# Patient Record
Sex: Female | Born: 1965 | Race: Black or African American | Hispanic: No | Marital: Single | Smoking: Never smoker
Health system: Southern US, Community
[De-identification: ages and names within clinical notes are randomized; demographics above are authoritative.]

## PROBLEM LIST (undated history)

## (undated) DIAGNOSIS — E119 Type 2 diabetes mellitus without complications: Secondary | ICD-10-CM

## (undated) DIAGNOSIS — I10 Essential (primary) hypertension: Secondary | ICD-10-CM

---

## 2007-08-13 ENCOUNTER — Emergency Department (HOSPITAL_COMMUNITY): Admission: EM | Admit: 2007-08-13 | Discharge: 2007-08-14 | Payer: Self-pay | Admitting: Emergency Medicine

## 2008-04-30 ENCOUNTER — Ambulatory Visit: Payer: Self-pay | Admitting: Internal Medicine

## 2008-05-05 ENCOUNTER — Ambulatory Visit: Payer: Self-pay | Admitting: *Deleted

## 2008-05-07 ENCOUNTER — Ambulatory Visit (HOSPITAL_COMMUNITY): Admission: RE | Admit: 2008-05-07 | Discharge: 2008-05-07 | Payer: Self-pay | Admitting: Family Medicine

## 2008-06-07 ENCOUNTER — Ambulatory Visit: Payer: Self-pay | Admitting: Internal Medicine

## 2008-06-07 LAB — CONVERTED CEMR LAB
AST: 14 units/L (ref 0–37)
Albumin: 4.2 g/dL (ref 3.5–5.2)
Alkaline Phosphatase: 56 units/L (ref 39–117)
BUN: 15 mg/dL (ref 6–23)
Basophils Relative: 0 % (ref 0–1)
Calcium: 9.6 mg/dL (ref 8.4–10.5)
Chloride: 106 meq/L (ref 96–112)
Glucose, Bld: 98 mg/dL (ref 70–99)
HDL: 49 mg/dL (ref >39)
LDL Cholesterol: 135 mg/dL — ABNORMAL HIGH (ref 0–99)
Lymphs Abs: 2.9 10*3/uL (ref 0.7–4.0)
Monocytes Relative: 6 % (ref 3–12)
Neutro Abs: 2 10*3/uL (ref 1.7–7.7)
Neutrophils Relative %: 36 % — ABNORMAL LOW (ref 43–77)
Platelets: 343 10*3/uL (ref 150–400)
Potassium: 4 meq/L (ref 3.5–5.3)
RBC: 4.64 M/uL (ref 3.87–5.11)
Sodium: 141 meq/L (ref 135–145)
TSH: 1.344 microintl units/mL (ref 0.350–4.50)
Total Protein: 7.4 g/dL (ref 6.0–8.3)
WBC: 5.5 10*3/uL (ref 4.0–10.5)

## 2008-06-25 ENCOUNTER — Ambulatory Visit: Payer: Self-pay | Admitting: Internal Medicine

## 2008-06-25 ENCOUNTER — Encounter: Payer: Self-pay | Admitting: Family Medicine

## 2008-06-26 ENCOUNTER — Encounter: Payer: Self-pay | Admitting: Family Medicine

## 2011-06-11 LAB — URINALYSIS, ROUTINE W REFLEX MICROSCOPIC
Bilirubin Urine: NEGATIVE
Glucose, UA: >1000 — AB
Ketones, ur: >80 — AB
Leukocytes, UA: NEGATIVE
Protein, ur: NEGATIVE
pH: 5

## 2011-06-11 LAB — DIFFERENTIAL
Eosinophils Absolute: 0.1 — ABNORMAL LOW
Eosinophils Relative: 1
Lymphocytes Relative: 25
Lymphs Abs: 2.1
Monocytes Absolute: 0.3
Monocytes Relative: 4

## 2011-06-11 LAB — CBC
HCT: 41.4
Hemoglobin: 13.8
MCV: 86.7
RBC: 4.77
WBC: 8.2

## 2011-06-11 LAB — BASIC METABOLIC PANEL
BUN: 15
Chloride: 98
GFR calc non Af Amer: 50 — ABNORMAL LOW
Potassium: 4.1
Sodium: 132 — ABNORMAL LOW

## 2011-06-11 LAB — URINE MICROSCOPIC-ADD ON

## 2012-12-05 ENCOUNTER — Emergency Department (HOSPITAL_COMMUNITY)
Admission: EM | Admit: 2012-12-05 | Discharge: 2012-12-05 | Disposition: A | Discharge status: Home / Self Care | Payer: Self-pay | Attending: Emergency Medicine | Admitting: Emergency Medicine

## 2012-12-05 ENCOUNTER — Encounter (HOSPITAL_COMMUNITY): Payer: Self-pay | Admitting: Emergency Medicine

## 2012-12-05 DIAGNOSIS — K089 Disorder of teeth and supporting structures, unspecified: Secondary | ICD-10-CM | POA: Insufficient documentation

## 2012-12-05 DIAGNOSIS — K029 Dental caries, unspecified: Secondary | ICD-10-CM

## 2012-12-05 DIAGNOSIS — R221 Localized swelling, mass and lump, neck: Secondary | ICD-10-CM | POA: Insufficient documentation

## 2012-12-05 DIAGNOSIS — R220 Localized swelling, mass and lump, head: Secondary | ICD-10-CM | POA: Insufficient documentation

## 2012-12-05 MED ORDER — PENICILLIN V POTASSIUM 500 MG PO TABS: 500.0000 mg | ORAL_TABLET | Freq: Three times a day (TID) | ORAL | Status: DC

## 2012-12-05 MED ORDER — HYDROCODONE-ACETAMINOPHEN 5-325 MG PO TABS: 1.0000 | ORAL_TABLET | Freq: Four times a day (QID) | ORAL | Status: DC | PRN

## 2012-12-05 NOTE — ED Notes (Signed)
Pt c/o dental abscess to right posterior portion of mouth. Pt states it is painful to chew on that side but denies difficulty swallowing.

## 2012-12-05 NOTE — ED Provider Notes (Signed)
History    This chart was scribed for non-physician practitioner working with Olivia Mackie, MD by Frederik Pear, ED Scribe. This patient was seen in room TR04C/TR04C and the patient's care was started at 2312.   CSN: [id number]  Arrival date & time 12/05/12  2132   First MD Initiated Contact with Patient 12/05/12 2312      Chief Complaint  Patient presents with  . Dental Pain    (Consider location/radiation/quality/duration/timing/severity/associated sxs/prior treatment) Patient is a 47 y.o. female presenting with tooth pain. The history is provided by the patient and medical records. No language interpreter was used.  Dental PainThe primary symptoms include mouth pain and dental injury. The symptoms began yesterday. The symptoms are worsening. The symptoms are new. The symptoms occur constantly.  Additional symptoms include: facial swelling.    Tracie Ellis is a 47 y.o. female who presents to the Emergency Department complaining of sudden onset, constant, gradually worsening dental pain to the right upper posterior mouth with associated facial swelling to the right jaw that is aggravated by chewing on the right side that began yesterday. She reports that she has been treating the pain with ibuprofen, which has provided relief. She denies having a dentist.   History reviewed. No pertinent past medical history.  History reviewed. No pertinent past surgical history.  No family history on file.  History  Substance Use Topics  . Smoking status: Never Smoker   . Smokeless tobacco: Not on file  . Alcohol Use: No    OB History   Grav Para Term Preterm Abortions TAB SAB Ect Mult Living                  Review of Systems  HENT: Positive for facial swelling and dental problem.   All other systems reviewed and are negative.    Allergies  Review of patient's allergies indicates no known allergies.  Home Medications   Current Outpatient Rx  Name  Route  Sig  Dispense  Refill   . ibuprofen (ADVIL,MOTRIN) 200 MG tablet   Oral   Take 1,400 mg by mouth daily as needed for pain.           BP 156/101  Pulse 77  Temp(Src) 98.1 F (36.7 C) (Oral)  Resp 20  SpO2 95%  Physical Exam  Nursing note and vitals reviewed. Constitutional: She is oriented to person, place, and time. She appears well-developed and well-nourished. No distress.  HENT:  Head: Normocephalic and atraumatic. No trismus in the jaw.  Mouth/Throat: Uvula is midline and oropharynx is clear and moist. No oral lesions. Abnormal dentition. Dental caries present. No dental abscesses.    Tooth #1 is broken with swelling appreciated to the right upper jaw.  Eyes: EOM are normal. Pupils are equal, round, and reactive to light.  Neck: Normal range of motion. Neck supple. No tracheal deviation present.  Cardiovascular: Normal rate.   Pulmonary/Chest: Effort normal. No respiratory distress.  Abdominal: Soft. She exhibits no distension.  Musculoskeletal: Normal range of motion. She exhibits no edema.  Neurological: She is alert and oriented to person, place, and time.  Skin: Skin is warm and dry.  Psychiatric: She has a normal mood and affect. Her behavior is normal.    ED Course  Procedures (including critical care time)  DIAGNOSTIC STUDIES: Oxygen Saturation is 95% on room air, adequate by my interpretation.    COORDINATION OF CARE:  23:13- Discussed planned course of treatment with the patient, including a course of  antibiotics and following up with a dentist, who is agreeable at this time.   Labs Reviewed - No data to display No results found.   No diagnosis found.  Dental pain.  Facial swellling.  Antibiotic, analgesic, dental referral.  MDM   I personally performed the services described in this documentation, which was scribed in my presence. The recorded information has been reviewed and is accurate.         Jimmye Norman, NP 12/05/12 [phone]

## 2012-12-05 NOTE — ED Notes (Addendum)
C/o R upper dental pain since yesterday.  Pain relieved with ibuprofen.  Denies pain at present.

## 2012-12-06 NOTE — ED Provider Notes (Signed)
Medical screening examination/treatment/procedure(s) were performed by non-physician practitioner and as supervising physician I was immediately available for consultation/collaboration.  Olivia Mackie, MD 12/06/12 [phone]

## 2013-03-19 ENCOUNTER — Emergency Department (INDEPENDENT_AMBULATORY_CARE_PROVIDER_SITE_OTHER)
Admission: EM | Admit: 2013-03-19 | Discharge: 2013-03-19 | Disposition: A | Discharge status: Home / Self Care | Payer: PRIVATE HEALTH INSURANCE | Source: Home / Self Care | Attending: Family Medicine | Admitting: Family Medicine

## 2013-03-19 ENCOUNTER — Encounter (HOSPITAL_COMMUNITY): Payer: Self-pay | Admitting: Emergency Medicine

## 2013-03-19 DIAGNOSIS — J020 Streptococcal pharyngitis: Secondary | ICD-10-CM

## 2013-03-19 LAB — POCT RAPID STREP A: Streptococcus, Group A Screen (Direct): NEGATIVE

## 2013-03-19 MED ORDER — AMOXICILLIN 500 MG PO CAPS: 500.0000 mg | ORAL_CAPSULE | Freq: Three times a day (TID) | ORAL | Status: DC

## 2013-03-19 NOTE — ED Provider Notes (Signed)
   History    CSN: [id number] Arrival date & time 03/19/13  1221  First MD Initiated Contact with Patient 03/19/13 1408     No chief complaint on file.  (Consider location/radiation/quality/duration/timing/severity/associated sxs/prior Treatment) Patient is a 47 y.o. female presenting with pharyngitis. The history is provided by the patient.  Sore Throat This is a new problem. The current episode started more than 2 days ago. The problem has not changed since onset.The symptoms are aggravated by swallowing.   History reviewed. No pertinent past medical history. History reviewed. No pertinent past surgical history. No family history on file. History  Substance Use Topics  . Smoking status: Never Smoker   . Smokeless tobacco: Not on file  . Alcohol Use: No   OB History   Grav Para Term Preterm Abortions TAB SAB Ect Mult Living                 Review of Systems  Constitutional: Negative.   HENT: Positive for sore throat.   Hematological: Positive for adenopathy.    Allergies  Review of patient's allergies indicates no known allergies.  Home Medications   Current Outpatient Rx  Name  Route  Sig  Dispense  Refill  . amoxicillin (AMOXIL) 500 MG capsule   Oral   Take 1 capsule (500 mg total) by mouth 3 (three) times daily.   30 capsule   0   . HYDROcodone-acetaminophen (NORCO/VICODIN) 5-325 MG per tablet   Oral   Take 1 tablet by mouth every 6 (six) hours as needed for pain.   12 tablet   0   . ibuprofen (ADVIL,MOTRIN) 200 MG tablet   Oral   Take 1,400 mg by mouth daily as needed for pain.         Marland Kitchen penicillin v potassium (VEETID) 500 MG tablet   Oral   Take 1 tablet (500 mg total) by mouth 3 (three) times daily.   30 tablet   0    BP 151/84  Pulse 80  Temp(Src) 98.1 F (36.7 C) (Oral)  Resp 16  SpO2 100% Physical Exam  Nursing note and vitals reviewed. Constitutional: She is oriented to person, place, and time. She appears well-developed and  well-nourished.  HENT:  Head: Normocephalic.  Right Ear: External ear normal.  Left Ear: External ear normal.  Mouth/Throat: Uvula is midline and mucous membranes are normal. Oropharyngeal exudate and posterior oropharyngeal erythema present.  Neck: Normal range of motion. Neck supple.  Pulmonary/Chest: Breath sounds normal.  Lymphadenopathy:    She has cervical adenopathy.  Neurological: She is alert and oriented to person, place, and time.  Skin: Skin is warm and dry.    ED Course  Procedures (including critical care time) Labs Reviewed - No data to display No results found. 1. Strep sore throat     MDM    Linna Hoff, MD 03/19/13 1419

## 2013-03-19 NOTE — ED Notes (Signed)
Pt c/o ST onset Sunday that's gradually getting worse... sxs include odynophagia, chills... Tracie Ellis: f/v/n/d... She is alert w/no signs of acute distress.

## 2013-03-22 LAB — CULTURE, GROUP A STREP

## 2013-03-22 NOTE — ED Notes (Addendum)
Throat culture: Strep beta hemolytic not group A.  Pt. adequately treated with Amoxicillin. Vassie Moselle 03/22/2013

## 2013-09-17 ENCOUNTER — Emergency Department (HOSPITAL_COMMUNITY)
Admission: EM | Admit: 2013-09-17 | Discharge: 2013-09-17 | Disposition: A | Discharge status: Home / Self Care | Payer: PRIVATE HEALTH INSURANCE | Attending: Emergency Medicine | Admitting: Emergency Medicine

## 2013-09-17 ENCOUNTER — Encounter (HOSPITAL_COMMUNITY): Payer: Self-pay | Admitting: Emergency Medicine

## 2013-09-17 DIAGNOSIS — K029 Dental caries, unspecified: Secondary | ICD-10-CM

## 2013-09-17 MED ORDER — TRAMADOL HCL 50 MG PO TABS: 50.0000 mg | ORAL_TABLET | Freq: Four times a day (QID) | ORAL | Status: DC | PRN

## 2013-09-17 MED ORDER — PENICILLIN V POTASSIUM 500 MG PO TABS: 500.0000 mg | ORAL_TABLET | Freq: Four times a day (QID) | ORAL | Status: DC

## 2013-09-17 NOTE — ED Notes (Signed)
Pt c/o L sided dental pain x 3 days.

## 2013-09-17 NOTE — Discharge Instructions (Signed)
Dental Pain °Toothache is pain in or around a tooth. It may get worse with chewing or with cold or heat.  °HOME CARE °· Your dentist may use a numbing medicine during treatment. If so, you may need to avoid eating until the medicine wears off. Ask your dentist about this. °· Only take medicine as told by your dentist or doctor. °· Avoid chewing food near the painful tooth until after all treatment is done. Ask your dentist about this. °GET HELP RIGHT AWAY IF:  °· The problem gets worse or new problems appear. °· You have a fever. °· There is redness and puffiness (swelling) of the face, jaw, or neck. °· You cannot open your mouth. °· There is pain in the jaw. °· There is very bad pain that is not helped by medicine. °MAKE SURE YOU:  °· Understand these instructions. °· Will watch your condition. °· Will get help right away if you are not doing well or get worse. °Document Released: 02/06/2008 Document Revised: 11/12/2011 Document Reviewed: 02/06/2008 °[employer]® Patient Information ©2014 [employer]. ° °Dental Caries °Dental caries is tooth decay. This decay can cause a hole in teeth (cavity) that can get bigger and deeper over time. °HOME CARE °· Brush and floss your teeth. Do this at least two times a day. °· Use a fluoride toothpaste. °· Use a mouth rinse if told by your dentist or doctor. °· Eat less sugary and starchy foods. Drink less sugary drinks. °· Avoid snacking often on sugary and starchy foods. Avoid sipping often on sugary drinks. °· Keep regular checkups and cleanings with your dentist. °· Use fluoride supplements if told by your dentist or doctor. °· Allow fluoride to be applied to teeth if told by your dentist or doctor. °MAKE SURE YOU: °· Understand these instructions. °· Will watch your condition. °· Will get help right away if you are not doing well or get worse. °Document Released: 05/29/2008 Document Revised: 04/22/2013 Document Reviewed: 08/22/2012 °[employer]® Patient Information ©2014  [employer]. ° °

## 2013-09-17 NOTE — ED Provider Notes (Signed)
Medical screening examination/treatment/procedure(s) were performed by non-physician practitioner and as supervising physician I was immediately available for consultation/collaboration.  EKG Interpretation   None         Darlys Galesavid Masneri, MD 09/17/13 1723

## 2013-09-17 NOTE — ED Provider Notes (Signed)
CSN: [id number]     Arrival date & time 09/17/13  1157 History   First MD Initiated Contact with Patient 09/17/13 1232     Chief Complaint  Patient presents with  . Dental Pain   (Consider location/radiation/quality/duration/timing/severity/associated sxs/prior Treatment) HPI Pt is a 48yo female c/o left lower molar pain that started 3 days ago. Pain is constant, aching and throbbing, gradually worsening. Worse with chewing, 7/10. Denies fever, n/v/d. Denies difficulty breathing or swallowing. Has been seen by Dr. Leanord Asal, DDS, in past but states she cannot return at this time as she does not have dental insurance. Has taken ibuprofen and used oragel with mild relief of pain.  History reviewed. No pertinent past medical history. History reviewed. No pertinent past surgical history. History reviewed. No pertinent family history. History  Substance Use Topics  . Smoking status: Never Smoker   . Smokeless tobacco: Not on file  . Alcohol Use: No   OB History   Grav Para Term Preterm Abortions TAB SAB Ect Mult Living                 Review of Systems  Constitutional: Negative for fever and chills.  HENT: Positive for dental problem. Negative for sore throat, trouble swallowing and voice change.   Respiratory: Negative for shortness of breath and stridor.   Cardiovascular: Negative for chest pain.  Gastrointestinal: Negative for nausea, vomiting and diarrhea.  All other systems reviewed and are negative.    Allergies  Review of patient's allergies indicates no known allergies.  Home Medications   Current Outpatient Rx  Name  Route  Sig  Dispense  Refill  . ibuprofen (ADVIL,MOTRIN) 200 MG tablet   Oral   Take 600-800 mg by mouth every 6 (six) hours as needed for moderate pain.         Marland Kitchen penicillin v potassium (VEETID) 500 MG tablet   Oral   Take 1 tablet (500 mg total) by mouth 4 (four) times daily.   40 tablet   0   . traMADol (ULTRAM) 50 MG tablet   Oral   Take 1  tablet (50 mg total) by mouth every 6 (six) hours as needed.   15 tablet   0    BP 152/100  Pulse 76  Temp(Src) 98.6 F (37 C) (Oral)  Resp 20  SpO2 98% Physical Exam  Nursing note and vitals reviewed. Constitutional: She appears well-developed and well-nourished. No distress.  HENT:  Head: Normocephalic and atraumatic.  Nose: Nose normal.  Mouth/Throat: Uvula is midline, oropharynx is clear and moist and mucous membranes are normal. She does not have dentures. No oral lesions. No trismus in the jaw. Abnormal dentition. Dental caries present. No dental abscesses, uvula swelling or lacerations. No oropharyngeal exudate, posterior oropharyngeal edema, posterior oropharyngeal erythema or tonsillar abscesses.    Eyes: Conjunctivae are normal. No scleral icterus.  Neck: Normal range of motion. Neck supple.  Cardiovascular: Normal rate.   Pulmonary/Chest: Effort normal. No respiratory distress.  Musculoskeletal: Normal range of motion.  Lymphadenopathy:    She has no cervical adenopathy.  Neurological: She is alert.  Skin: Skin is warm and dry. She is not diaphoretic.  Psychiatric: She has a normal mood and affect. Her behavior is normal.    ED Course  Procedures (including critical care time) Labs Review Labs Reviewed - No data to display Imaging Review No results found.  EKG Interpretation   None       MDM   1. Pain due  to dental caries    Pt with dental caries c/o dental pain. No dental abscess present. Will tx with PCN and tramadol, advised to f/u with Dr. Lucky CowboyKnox, DDS, within 24-48 hours for further evaluation and tx of pain.    Junius Finnerrin O'Malley, PA-C 09/17/13 1327

## 2014-01-30 ENCOUNTER — Emergency Department (HOSPITAL_COMMUNITY)
Admission: EM | Admit: 2014-01-30 | Discharge: 2014-01-30 | Disposition: A | Discharge status: Home / Self Care | Payer: PRIVATE HEALTH INSURANCE | Attending: Emergency Medicine | Admitting: Emergency Medicine

## 2014-01-30 ENCOUNTER — Encounter (HOSPITAL_COMMUNITY): Payer: Self-pay | Admitting: Emergency Medicine

## 2014-01-30 DIAGNOSIS — R600 Localized edema: Secondary | ICD-10-CM

## 2014-01-30 DIAGNOSIS — I10 Essential (primary) hypertension: Secondary | ICD-10-CM

## 2014-01-30 DIAGNOSIS — M7989 Other specified soft tissue disorders: Secondary | ICD-10-CM

## 2014-01-30 DIAGNOSIS — Z3202 Encounter for pregnancy test, result negative: Secondary | ICD-10-CM | POA: Insufficient documentation

## 2014-01-30 DIAGNOSIS — Z792 Long term (current) use of antibiotics: Secondary | ICD-10-CM | POA: Insufficient documentation

## 2014-01-30 DIAGNOSIS — R609 Edema, unspecified: Secondary | ICD-10-CM | POA: Insufficient documentation

## 2014-01-30 HISTORY — DX: Essential (primary) hypertension: I10

## 2014-01-30 LAB — COMPREHENSIVE METABOLIC PANEL
ALK PHOS: 53 U/L (ref 39–117)
ALT: 20 U/L (ref 0–35)
AST: 22 U/L (ref 0–37)
Albumin: 3.6 g/dL (ref 3.5–5.2)
BILIRUBIN TOTAL: 0.2 mg/dL — AB (ref 0.3–1.2)
BUN: 18 mg/dL (ref 6–23)
CALCIUM: 9.7 mg/dL (ref 8.4–10.5)
CHLORIDE: 107 meq/L (ref 96–112)
CO2: 25 meq/L (ref 19–32)
Creatinine, Ser: 0.95 mg/dL (ref 0.50–1.10)
GFR, EST AFRICAN AMERICAN: 80 mL/min — AB (ref >90)
GFR, EST NON AFRICAN AMERICAN: 69 mL/min — AB (ref >90)
GLUCOSE: 94 mg/dL (ref 70–99)
POTASSIUM: 4.4 meq/L (ref 3.7–5.3)
SODIUM: 145 meq/L (ref 137–147)
Total Protein: 7.5 g/dL (ref 6.0–8.3)

## 2014-01-30 LAB — CBC WITH DIFFERENTIAL/PLATELET
Basophils Absolute: 0 10*3/uL (ref 0.0–0.1)
Basophils Relative: 0 % (ref 0–1)
EOS PCT: 5 % (ref 0–5)
Eosinophils Absolute: 0.3 10*3/uL (ref 0.0–0.7)
HEMATOCRIT: 40.6 % (ref 36.0–46.0)
Hemoglobin: 13.1 g/dL (ref 12.0–15.0)
LYMPHS ABS: 2.7 10*3/uL (ref 0.7–4.0)
LYMPHS PCT: 41 % (ref 12–46)
MCH: 29.2 pg (ref 26.0–34.0)
MCHC: 32.3 g/dL (ref 30.0–36.0)
MCV: 90.4 fL (ref 78.0–100.0)
Monocytes Absolute: 0.5 10*3/uL (ref 0.1–1.0)
Monocytes Relative: 7 % (ref 3–12)
NEUTROS ABS: 3 10*3/uL (ref 1.7–7.7)
Neutrophils Relative %: 47 % (ref 43–77)
PLATELETS: 247 10*3/uL (ref 150–400)
RBC: 4.49 MIL/uL (ref 3.87–5.11)
RDW: 13.5 % (ref 11.5–15.5)
WBC: 6.5 10*3/uL (ref 4.0–10.5)

## 2014-01-30 LAB — POC URINE PREG, ED: Preg Test, Ur: NEGATIVE

## 2014-01-30 LAB — PRO B NATRIURETIC PEPTIDE: Pro B Natriuretic peptide (BNP): 22.2 pg/mL (ref 0–125)

## 2014-01-30 LAB — CBG MONITORING, ED: Glucose-Capillary: 92 mg/dL (ref 70–99)

## 2014-01-30 NOTE — ED Notes (Signed)
Patient unable to void at this time.  Patient states that she just went.

## 2014-01-30 NOTE — ED Provider Notes (Signed)
  This was a shared visit with a mid-level provided (NP or PA).  Throughout the patient's course I was available for consultation/collaboration.  I saw the ECG (if appropriate), relevant labs and studies - I agree with the interpretation.  On my exam the patient was in no distress.  She did have asym R>L LE edema, but no overt E/O DVT or infection.  With reassuring findings here, she was d/c in stable condition.        Gerhard Munch, MD 01/30/14 1620

## 2014-01-30 NOTE — Progress Notes (Signed)
*  PRELIMINARY RESULTS* Vascular Ultrasound Lower extremity venous dupex has been completed.  Preliminary findings: no evidence of DVT or baker's cyst.  Farrel Demark, RDMS, RVT  01/30/2014, 10:35 AM

## 2014-01-30 NOTE — Discharge Instructions (Signed)
Call for a follow up appointment with a Family or Primary Care Provider for further evaluation of your blood pressure. Go to a medical supply store and purchase compression stockings to reduce swelling in your lower extremities.  Elevate your legs when you are not walking or standing. Return if Symptoms worsen.   Take medication as prescribed.    Emergency Department Resource Guide 1) Find a Doctor and Pay Out of Pocket Although you won't have to find out who is covered by your insurance plan, it is a good idea to ask around and get recommendations. You will then need to call the office and see if the doctor you have chosen will accept you as a new patient and what types of options they offer for patients who are self-pay. Some doctors offer discounts or will set up payment plans for their patients who do not have insurance, but you will need to ask so you aren't surprised when you get to your appointment.  2) Contact Your Local Health Department Not all health departments have doctors that can see patients for sick visits, but many do, so it is worth a call to see if yours does. If you don't know where your local health department is, you can check in your phone book. The CDC also has a tool to help you locate your state's health department, and many state websites also have listings of all of their local health departments.  3) Find a Walk-in Clinic If your illness is not likely to be very severe or complicated, you may want to try a walk in clinic. These are popping up all over the country in pharmacies, drugstores, and shopping centers. They're usually staffed by nurse practitioners or physician assistants that have been trained to treat common illnesses and complaints. They're usually fairly quick and inexpensive. However, if you have serious medical issues or chronic medical problems, these are probably not your best option.  No Primary Care Doctor: - Call Health Connect at  [phone] - they  can help you locate a primary care doctor that  accepts your insurance, provides certain services, etc. - Physician Referral Service- [phone]  Chronic Pain Problems: Organization         Address  Phone   Notes  [facility]  [phone] Patients need to be referred by their primary care doctor.   Medication Assistance: Organization         Address  Phone   Notes  [facility] [address] [place], [place] [address] [phone]) [phone] --Must be a resident of [place] [facility] [place] -- Must have NO insurance coverage whatsoever (no Medicaid/ Medicare, etc.) -- The pt. MUST have a primary care doctor that directs their care regularly and follows them in the community   [facility]ist  [phone]   [employer]  [phone]) [phone]    Agencies that provide inexpensive medical care: Organization         Address  Phone   Notes  [facility]  [phone]) [phone]   [facility]    [phone]) [phone]   [facility] [address] [place], [place] [address] [phone]   [facility] [address], [place] [phone]   [facility]    [phone](336) [phone]   [facility]    [phone]) [phone]   [facility]  [address], [place] Phone:  [phone]) [phone],  Fax:  [phone] Hours of Operation:  9 am - 6 pm, M-F.  Also accepts Medicaid/Medicare and self-pay.  °[facility] ° [address], [place] Phone: [phone], Fax: [phone]. Hours of Operation:  8:30 am - 5:30 pm, M-F.  Also accepts Medicaid and self-pay.  °[facility] [place] [address], [place] Phone: ([phone]) [phone]   °[facility] [address], [place], [place] ([phone], Ext. 123 Mondays & Thursdays: 7-9 AM.  First 15 patients are seen on a first come, first serve basis. °  ° °Medicaid-accepting  [place] Providers: ° °Organization         Address  Phone   Notes  °[facility] [address], [place] [phone] Also accepts self-pay patients.  °[facility] [address], [place] ° ([phone]) [phone]   °[facility] [address], [place] ([phone]) [phone]   °[facility] [address] [place] [address], [place] ([phone]) [phone]   °Veita Bland [address], [place]  ° ([phone]) [phone] Only accepts [place] Access Medicaid patients after they have their name applied to their card.  ° °Self-Pay (no insurance) in [place]: ° °Organization         Address  Phone   Notes  °Sickle Cell Patients, [facility] [address], [place] ([phone]) [phone]   °[facility] [address], [place] ([phone]) [phone]   °[facility] ° [address], [place] ([phone]) [phone]   °[facility]/Dr. Osei-Bonsu ° [address] [place] [address], [place] or [address], [place] ([phone]) [phone] Phone number for both [place] and [place] locations is the same.  °[facility] [address], [place] ([phone]) [phone]   °[facility] [address] [place] [address], [place] or [address] (336) [phone] °(336) [phone]   °[facility] [address], [place] (336) [phone], phone; ([phone]) [phone], fax Sees patients 1st and 3rd Saturday of every month.  Must not qualify for public or private insurance (i.e. Medicaid, Medicare, [place] Health Choice, Veterans' Benefits) • Household income should be no more than 200% of the poverty level •The clinic cannot treat you if you are pregnant or think you are pregnant • Sexually transmitted diseases are not treated at the clinic.  ° ° °Dental Care: °Organization         Address  Phone  Notes  °[facility] [facility] [address], [place] ([phone]) [phone] Accepts children up to age 21 who are enrolled in Medicaid or [place] Health Choice; pregnant women with a Medicaid card; and children who have applied for Medicaid or [place] Health Choice, but were declined, whose parents can pay a reduced fee at time of service.  °[facility] [place]  [address], [place] (336) [phone] Accepts children up to age 21 who are enrolled in Medicaid or [place] Health Choice; pregnant women with a Medicaid card; and children who have applied for Medicaid or [place] Health Choice, but were declined, whose parents can pay a reduced fee at time of service.  °[facility] ° [address],  ([phone]) [phone] Patients are seen by appointment only. Walk-ins are not   accepted. [facility] will see patients 18 years of age and older. °Monday - Tuesday (8am-5pm) °Most Wednesdays (8:30-5pm) °$30 per visit, cash only  °[facility] ° [address], [place] ([phone]) [phone] Patients are seen by appointment only. Walk-ins are not accepted. [facility] will see patients 18 years of age and older. °One Wednesday Evening (Monthly: Volunteer Based).  $30 per visit, cash only  °[facility]  ([phone]) [phone] for adults; Children under age 4, call Graduate Pediatric Dentistry at [phone]. Children aged 4-14, please call [phone] to request a pediatric application. ° Dental services are provided in all areas of dental care including fillings, crowns and bridges, complete and partial dentures, implants, gum treatment, root canals, and extractions. Preventive care is also provided. Treatment is provided to both adults and children. °Patients are selected via a lottery and there is often a waiting list. °  °[facility] [address], °[place] ° (336) 763-8833 [email or web address] °  °[facility] [address], [place], [place] (336)[phone], Ext. 123 Second and Fourth Thursday of each month, opens at 6:30 AM; Clinic ends at 9 AM.  Patients are seen on a first-come first-served basis, and a limited number are seen during each clinic.  ° °[facility] ° [address], [place], [place] ([phone]) [phone]   Eligibility Requirements °You must have lived in [place], [place], or [place] counties for at least the last three months. °  You cannot be eligible for state or federal sponsored healthcare insurance, including Veterans Administration, Medicaid, or Medicare. °  You generally cannot be eligible for healthcare insurance through your employer.  °  How to apply: °Eligibility screenings are held every Tuesday and Wednesday afternoon from 1:00 pm until 4:00 pm. You do not need an appointment for the interview!  °[facility] [address], [place], [place] [phone]   °[facility]  [phone]   °[facility]  [phone]   °[facility]  336-570-6415   ° °Behavioral Health Resources in the Community: °Intensive Outpatient Programs °Organization         Address  Phone  Notes  °[facility] [address], [place], [place] [phone]   °[facility] [address], [place], [place] [phone]   °[facility] [address], [place], [place] ° 336-882-2125   °[facility] [address],  °[place], [place] 1-800-853-5163 or 336-641-4981   °Substance Abuse Resources °Organization         Address  Phone  Notes  °[facility]  336-882-2125   °[facility]  336-784-9470   °[facility]  336-285-9073   °[facility]  336-845-3988   °Residential & Outpatient Substance Abuse Program  1-800-659-3381   °Psychological Services °Organization         Address  Phone  Notes  °[facility]   336- 832-9600   °[facility]  336- 378-7881   °[facility] [address], [place] 1-800-853-5163 or 336-641-4981   ° °Mobile Crisis Teams °Organization         Address  Phone  Notes  °[facility], Mobile Crisis Care Unit  1-877-626-1772   °Assertive °[facility] ° [address] [place], [place] 336-834-9664   °Sharon DeEsch [address] °[place]  336-554-5454   ° °Self-Help/Support Groups °  Organization         Address  Phone             Notes  [facility] - variety of support groups  336- I7437963 Call for more information  Narcotics Anonymous (NA), [facility] [address], [employer] [place]  2 meetings at this location   Statistician         Address  Phone  Notes  [facility] [address],    [place]  [phone]   [facility]  [address] [place] [address], [place], [place] [phone]   [facility] [address] [place], [place] [phone] Admissions: 8am-3pm M-F  [facility] [address],    [place], [place] [phone]   [facility] [address] [place], [place], [place] [phone]   [facility] [address]  [place], [place] [phone]   [facility] Intensive Outpatient [address], [place], [place] [phone]   [facility] ([facility] [address] [place]  [place], [place] 5-993-570-1779 or [phone]   [facility] ([facility] [address] [place], [place] [phone] Accepts Medicaid  [facility] [address]  [place] [phone] Substance Abuse/Addiction Treatment   [facility]         Address  Phone  Notes  [facility]  [phone]   Angie Fava, PhD [address] [place], [place]   ([phone])[phone] or  [phone]   [facility]   [address] [place], [place] [phone]   [facility] [address] [place], [place] [phone] Insurance/Medicaid/sponsorship through [facility] [address]                                    [place], [place] [phone] Therapy/tele-psych/case  [facility] [address], [place] [phone]    Dr. Lolly Mustache  [phone]   [facility]  [employer] [facility] 1) [address] [place] 2) [address] [place] 3)  [address], [place] [phone]  [phone]   [facility] Child Abuse Hotline [phone] or [phone] (After Hours)

## 2014-01-30 NOTE — ED Notes (Signed)
Pt reports that she has noticed that she has had bilateral swelling in her lower legs. Reports that she works a lot of on her feet and has been elevating them at home but it does not seem to be helping.

## 2014-01-30 NOTE — ED Notes (Signed)
Pt brought back to room; pt getting undressed and into a gown at this time; Hayley, RN aware 

## 2014-01-30 NOTE — ED Provider Notes (Signed)
CSN: [id number]     Arrival date & time 01/30/14  7414 History   First MD Initiated Contact with Patient 01/30/14 [phone]     No chief complaint on file.    (Consider location/radiation/quality/duration/timing/severity/associated sxs/prior Treatment) HPI Comments: Patient is a 48 year old female with past medical history of diabetes and HTN present emergency department chief complaint of bilateral lower extremity edema for 2 weeks.  She reports intermittent lower extremity edema after working or standing all day. She reports similar swelling in the past which was relieved by elevating lower extremities.  She reports she was taken off all diabetes medication 2 years ago.  She reports mild pain with palpation. Denies chest pain, shortness of breath, fever or chills. Denis history of MI or heart failure.  No recent travel, family history or personal history of DVT/PE, lower extremity swelling, smoking, cancer, or exogenous estrogen.  NO PCP  The history is provided by the patient. No language interpreter was used.    No past medical history on file. No past surgical history on file. No family history on file. History  Substance Use Topics  . Smoking status: Never Smoker   . Smokeless tobacco: Not on file  . Alcohol Use: No   OB History   Grav Para Term Preterm Abortions TAB SAB Ect Mult Living                 Review of Systems  Constitutional: Negative for fever and chills.  Respiratory: Negative for cough, chest tightness and shortness of breath.   Cardiovascular: Positive for leg swelling. Negative for chest pain.  Gastrointestinal: Negative for abdominal pain.  Musculoskeletal: Negative for gait problem.  Skin: Negative for color change, rash and wound.  Neurological: Negative for seizures and weakness.  All other systems reviewed and are negative.     Allergies  Review of patient's allergies indicates no known allergies.  Home Medications   Prior to Admission medications    Medication Sig Start Date End Date Taking? Authorizing Provider  ibuprofen (ADVIL,MOTRIN) 200 MG tablet Take 600-800 mg by mouth every 6 (six) hours as needed for moderate pain.    Historical Provider, MD  penicillin v potassium (VEETID) 500 MG tablet Take 1 tablet (500 mg total) by mouth 4 (four) times daily. 09/17/13   Junius Finner, PA-C  traMADol (ULTRAM) 50 MG tablet Take 1 tablet (50 mg total) by mouth every 6 (six) hours as needed. 09/17/13   Junius Finner, PA-C   There were no vitals taken for this visit. Physical Exam  Nursing note and vitals reviewed. Constitutional: She is oriented to person, place, and time. She appears well-developed and well-nourished.  Non-toxic appearance. She does not have a sickly appearance. She does not appear ill. No distress.  HENT:  Head: Normocephalic and atraumatic.  Eyes: EOM are normal. Pupils are equal, round, and reactive to light. No scleral icterus.  Neck: Neck supple.  Cardiovascular: Normal rate, regular rhythm and normal heart sounds.   No murmur heard. 1+ pitting edema R>L proximal tibia. No increase warmth to touch. No erythema noted.  Pulmonary/Chest: Effort normal and breath sounds normal. No respiratory distress. She has no wheezes. She has no rales.  Abdominal: Soft. Bowel sounds are normal. There is no tenderness. There is no rebound and no guarding.  Musculoskeletal: Normal range of motion. She exhibits no edema.  Neurological: She is alert and oriented to person, place, and time.  Skin: Skin is warm and dry. No rash noted. She is not  diaphoretic. No erythema.  Psychiatric: She has a normal mood and affect. Her behavior is normal. Thought content normal.    ED Course  Procedures (including critical care time) Labs Review Results for orders placed during the hospital encounter of 01/30/14  CBC WITH DIFFERENTIAL      Result Value Ref Range   WBC 6.5  4.0 - 10.5 K/uL   RBC 4.49  3.87 - 5.11 MIL/uL   Hemoglobin 13.1  12.0 - 15.0  g/dL   HCT 16.1  09.6 - 04.5 %   MCV 90.4  78.0 - 100.0 fL   MCH 29.2  26.0 - 34.0 pg   MCHC 32.3  30.0 - 36.0 g/dL   RDW 40.9  81.1 - 91.4 %   Platelets 247  150 - 400 K/uL   Neutrophils Relative % 47  43 - 77 %   Neutro Abs 3.0  1.7 - 7.7 K/uL   Lymphocytes Relative 41  12 - 46 %   Lymphs Abs 2.7  0.7 - 4.0 K/uL   Monocytes Relative 7  3 - 12 %   Monocytes Absolute 0.5  0.1 - 1.0 K/uL   Eosinophils Relative 5  0 - 5 %   Eosinophils Absolute 0.3  0.0 - 0.7 K/uL   Basophils Relative 0  0 - 1 %   Basophils Absolute 0.0  0.0 - 0.1 K/uL  COMPREHENSIVE METABOLIC PANEL      Result Value Ref Range   Sodium 145  137 - 147 mEq/L   Potassium 4.4  3.7 - 5.3 mEq/L   Chloride 107  96 - 112 mEq/L   CO2 25  19 - 32 mEq/L   Glucose, Bld 94  70 - 99 mg/dL   BUN 18  6 - 23 mg/dL   Creatinine, Ser 7.82  0.50 - 1.10 mg/dL   Calcium 9.7  8.4 - 95.6 mg/dL   Total Protein 7.5  6.0 - 8.3 g/dL   Albumin 3.6  3.5 - 5.2 g/dL   AST 22  0 - 37 U/L   ALT 20  0 - 35 U/L   Alkaline Phosphatase 53  39 - 117 U/L   Total Bilirubin 0.2 (*) 0.3 - 1.2 mg/dL   GFR calc non Af Amer 69 (*) >90 mL/min   GFR calc Af Amer 80 (*) >90 mL/min  PRO B NATRIURETIC PEPTIDE      Result Value Ref Range   Pro B Natriuretic peptide (BNP) 22.2  0 - 125 pg/mL  CBG MONITORING, ED      Result Value Ref Range   Glucose-Capillary 92  70 - 99 mg/dL  POC URINE PREG, ED      Result Value Ref Range   Preg Test, Ur NEGATIVE  NEGATIVE   Imaging Review Vascular Ultrasound  Lower extremity venous duplex has been completed. Preliminary findings: no evidence of DVT or baker's cyst.    Date: 01/30/2014  Rate: 77  Rhythm: normal sinus rhythm  QRS Axis: normal  Intervals: normal  ST/T Wave abnormalities: normal  Conduction Disutrbances:none  Narrative Interpretation:   Old EKG Reviewed: none available    MDM   Final diagnoses:  Lower extremity edema  Hypertension   Pt presents with bilateral lower extremity edema, no  increase in temperature to touch.  Hypertensive on arrival, not on antihypertensive therapy at this time. CBC and CMP without concerning abnormalities. EKG normal. BNP normal. Discussed with Dr. 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 Duplex  negative. Discussed lab results, imaging results, and treatment plan with the patient. Advised patient to purchase compression stockings from a medical supply store and follow up with a PCP for further evaluation of blood pressure. Return precautions given. Reports understanding and no other concerns at this time.  Patient is stable for discharge at this time.   Clabe SealLauren M Murry Diaz, PA-C 01/30/14 1610

## 2014-01-30 NOTE — ED Notes (Signed)
Pt taken to vascular for doppler

## 2014-06-15 ENCOUNTER — Emergency Department (INDEPENDENT_AMBULATORY_CARE_PROVIDER_SITE_OTHER)
Admission: EM | Admit: 2014-06-15 | Discharge: 2014-06-15 | Disposition: A | Discharge status: Home / Self Care | Payer: Self-pay | Source: Home / Self Care | Attending: Family Medicine | Admitting: Family Medicine

## 2014-06-15 ENCOUNTER — Encounter (HOSPITAL_COMMUNITY): Payer: Self-pay | Admitting: Emergency Medicine

## 2014-06-15 DIAGNOSIS — I10 Essential (primary) hypertension: Secondary | ICD-10-CM

## 2014-06-15 LAB — POCT I-STAT, CHEM 8
BUN: 15 mg/dL (ref 6–23)
Calcium, Ion: 1.23 mmol/L (ref 1.12–1.23)
Chloride: 105 meq/L (ref 96–112)
Creatinine, Ser: 1.2 mg/dL — ABNORMAL HIGH (ref 0.50–1.10)
Glucose, Bld: 88 mg/dL (ref 70–99)
HCT: 49 % — ABNORMAL HIGH (ref 36.0–46.0)
Hemoglobin: 16.7 g/dL — ABNORMAL HIGH (ref 12.0–15.0)
Potassium: 4.3 meq/L (ref 3.7–5.3)
Sodium: 143 meq/L (ref 137–147)
TCO2: 26 mmol/L (ref 0–100)

## 2014-06-15 MED ORDER — HYDROCHLOROTHIAZIDE 12.5 MG PO TABS: 12.5000 mg | ORAL_TABLET | Freq: Every day | ORAL | Status: DC

## 2014-06-15 NOTE — ED Provider Notes (Signed)
CSN: [id number]     Arrival date & time 06/15/14  1544 History   First MD Initiated Contact with Patient 06/15/14 1712     Chief Complaint  Patient presents with  . Hypertension   (Consider location/radiation/quality/duration/timing/severity/associated sxs/prior Treatment) Patient is a 48 y.o. female presenting with hypertension. The history is provided by the patient.  Hypertension This is a new problem. The current episode started 3 to 5 hours ago (h/o hbp, no meds at present, no sx, found today to have hbp, sent here for eval.). The problem has been gradually improving. Pertinent negatives include no chest pain, no abdominal pain and no headaches.    Past Medical History  Diagnosis Date  . Hypertension    History reviewed. No pertinent past surgical history. History reviewed. No pertinent family history. History  Substance Use Topics  . Smoking status: Never Smoker   . Smokeless tobacco: Not on file  . Alcohol Use: No   OB History   Grav Para Term Preterm Abortions TAB SAB Ect Mult Living                 Review of Systems  Constitutional: Negative.   Respiratory: Negative.   Cardiovascular: Negative for chest pain and leg swelling.  Gastrointestinal: Negative for abdominal pain.  Neurological: Negative for headaches.    Allergies  Lisinopril  Home Medications   Prior to Admission medications   Medication Sig Start Date End Date Taking? Authorizing Provider  hydrochlorothiazide (HYDRODIURIL) 12.5 MG tablet Take 1 tablet (12.5 mg total) by mouth daily. 06/15/14   Linna HoffJames D Cristyn Crossno, MD  Multiple Vitamin (MULTIVITAMIN WITH MINERALS) TABS tablet Take 1 tablet by mouth daily.    Historical Provider, MD   BP 159/81  Pulse 87  Temp(Src) 98.1 F (36.7 C) (Oral)  Resp 16  SpO2 96% Physical Exam  Nursing note and vitals reviewed. Constitutional: She is oriented to person, place, and time. She appears well-developed and well-nourished.  HENT:  Right Ear: External ear  normal.  Mouth/Throat: Oropharynx is clear and moist.  Eyes: Pupils are equal, round, and reactive to light.  Neck: Normal range of motion. Neck supple.  Cardiovascular: Regular rhythm, normal heart sounds and intact distal pulses.   Pulmonary/Chest: Effort normal and breath sounds normal.  Musculoskeletal: She exhibits no edema.  Lymphadenopathy:    She has no cervical adenopathy.  Neurological: She is alert and oriented to person, place, and time.  Skin: Skin is warm and dry.    ED Course  Procedures (including critical care time) Labs Review Labs Reviewed  POCT I-STAT, CHEM 8 - Abnormal; Notable for the following:    Creatinine, Ser 1.20 (*)    Hemoglobin 16.7 (*)    HCT 49.0 (*)    All other components within normal limits   i-stat cr 1.2 otherwise wnl. Imaging Review No results found.   MDM   1. Essential hypertension        Linna HoffJames D Satya Bohall, MD 06/15/14 2018

## 2014-06-15 NOTE — Discharge Instructions (Signed)
Take medicine as prescribed and see your doctor for recheck

## 2014-06-15 NOTE — ED Notes (Signed)
[facility] and had a blood pressure reading of 210/120.  Denies blurred/spotty vision or headaches.

## 2015-06-29 ENCOUNTER — Other Ambulatory Visit: Payer: Self-pay | Admitting: Nurse Practitioner

## 2015-06-29 DIAGNOSIS — Z1231 Encounter for screening mammogram for malignant neoplasm of breast: Secondary | ICD-10-CM

## 2015-07-13 ENCOUNTER — Ambulatory Visit
Admission: RE | Admit: 2015-07-13 | Discharge: 2015-07-13 | Disposition: A | Discharge status: Home / Self Care | Payer: PRIVATE HEALTH INSURANCE | Source: Ambulatory Visit | Attending: Nurse Practitioner | Admitting: Nurse Practitioner

## 2015-07-13 DIAGNOSIS — Z1231 Encounter for screening mammogram for malignant neoplasm of breast: Secondary | ICD-10-CM

## 2016-02-16 ENCOUNTER — Emergency Department (HOSPITAL_COMMUNITY): Payer: Self-pay

## 2016-02-16 ENCOUNTER — Encounter (HOSPITAL_COMMUNITY): Payer: Self-pay | Admitting: Neurology

## 2016-02-16 ENCOUNTER — Emergency Department (HOSPITAL_COMMUNITY)
Admission: EM | Admit: 2016-02-16 | Discharge: 2016-02-16 | Disposition: A | Discharge status: Home / Self Care | Payer: Self-pay | Attending: Emergency Medicine | Admitting: Emergency Medicine

## 2016-02-16 DIAGNOSIS — R030 Elevated blood-pressure reading, without diagnosis of hypertension: Secondary | ICD-10-CM

## 2016-02-16 DIAGNOSIS — E119 Type 2 diabetes mellitus without complications: Secondary | ICD-10-CM | POA: Insufficient documentation

## 2016-02-16 DIAGNOSIS — IMO0001 Reserved for inherently not codable concepts without codable children: Secondary | ICD-10-CM

## 2016-02-16 DIAGNOSIS — Z79899 Other long term (current) drug therapy: Secondary | ICD-10-CM | POA: Insufficient documentation

## 2016-02-16 DIAGNOSIS — I10 Essential (primary) hypertension: Secondary | ICD-10-CM | POA: Insufficient documentation

## 2016-02-16 DIAGNOSIS — M25551 Pain in right hip: Secondary | ICD-10-CM | POA: Insufficient documentation

## 2016-02-16 HISTORY — DX: Type 2 diabetes mellitus without complications: E11.9

## 2016-02-16 LAB — CBG MONITORING, ED: Glucose-Capillary: 101 mg/dL — ABNORMAL HIGH (ref 65–99)

## 2016-02-16 MED ORDER — NAPROXEN 500 MG PO TABS: 500.0000 mg | ORAL_TABLET | Freq: Two times a day (BID) | ORAL | Status: DC

## 2016-02-16 MED ORDER — TRAMADOL HCL 50 MG PO TABS: 50.0000 mg | ORAL_TABLET | Freq: Four times a day (QID) | ORAL | Status: DC | PRN

## 2016-02-16 MED ORDER — HYDROCHLOROTHIAZIDE 12.5 MG PO TABS: 12.5000 mg | ORAL_TABLET | Freq: Every day | ORAL | Status: DC

## 2016-02-16 NOTE — ED Notes (Signed)
Is out of her BP medications.

## 2016-02-16 NOTE — ED Provider Notes (Signed)
CSN: [id number]     Arrival date & time 02/16/16  1309 History  By signing my name below, I, Tracie Ellis, attest that this documentation has been prepared under the direction and in the presence of Santiago Glad, PA-C Electronically Signed: Charline Bills, ED Scribe 02/16/2016 at 3:16 PM.   Chief Complaint  Patient presents with  . Leg Pain   The history is provided by the patient. No language interpreter was used.   HPI Comments: Tracie Ellis is a 50 y.o. female, with a h/o DM, who presents to the Emergency Department complaining of intermittent right hip pain for the past week. Pt denies injury. She reports increased hip pain with movement and sitting on a stool; improved pain with standing and bending her leg. She also reports intermittent tingling in her right foot that she attributes to DM. No medications tried PTA for hip pain. She denies fever, back pain, bladder/bowel incontinence, redness or swelling of the hip. Pt denies h/o similar pain.   Past Medical History  Diagnosis Date  . Hypertension   . Diabetes mellitus without complication (HCC)    History reviewed. No pertinent past surgical history. No family history on file. Social History  Substance Use Topics  . Smoking status: Never Smoker   . Smokeless tobacco: None  . Alcohol Use: No   OB History    No data available     Review of Systems  Constitutional: Negative for fever.  Musculoskeletal: Positive for arthralgias. Negative for back pain and joint swelling.  Skin: Negative for color change.  Neurological: Negative for numbness.  All other systems reviewed and are negative.  Allergies  Lisinopril  Home Medications   Prior to Admission medications   Medication Sig Start Date End Date Taking? Authorizing Provider  hydrochlorothiazide (HYDRODIURIL) 12.5 MG tablet Take 1 tablet (12.5 mg total) by mouth daily. 06/15/14   Linna Hoff, MD  Multiple Vitamin (MULTIVITAMIN WITH MINERALS) TABS tablet Take 1  tablet by mouth daily.    Historical Provider, MD   BP 178/110 mmHg  Pulse 104  Temp(Src) 98.4 F (36.9 C) (Oral)  Resp 20  SpO2 95% Physical Exam  Constitutional: She is oriented to person, place, and time. She appears well-developed and well-nourished. No distress.  HENT:  Head: Normocephalic and atraumatic.  Eyes: Conjunctivae and EOM are normal.  Neck: Neck supple. No tracheal deviation present.  Cardiovascular: Normal rate, regular rhythm and normal heart sounds.   Pulses:      Dorsalis pedis pulses are 2+ on the right side.  Pulmonary/Chest: Effort normal and breath sounds normal. No respiratory distress.  Musculoskeletal: Normal range of motion.  No tenderness to palpation of thoracic or lumbar spine Tenderness to palpation of the R hip No erythema, edema or warmth FROM of R hip Distal sensation intact  Neurological: She is alert and oriented to person, place, and time.  Skin: Skin is warm and dry.  Psychiatric: She has a normal mood and affect. Her behavior is normal.  Nursing note and vitals reviewed.  ED Course  Procedures (including critical care time) DIAGNOSTIC STUDIES: Oxygen Saturation is 95% on RA, adequate by my interpretation.    COORDINATION OF CARE: 2:23 PM-Discussed treatment plan which includes CBG and XR with pt at bedside and pt agreed to plan.   Labs Review Labs Reviewed  CBG MONITORING, ED - Abnormal; Notable for the following:    Glucose-Capillary 101 (*)    All other components within normal limits   Imaging Review  Dg Hip Unilat With Pelvis 2-3 Views Right  02/16/2016  CLINICAL DATA:  Right lateral hip pain.  New EXAM: DG HIP (WITH OR WITHOUT PELVIS) 2-3V RIGHT COMPARISON:  None. FINDINGS: There is no evidence of hip fracture or dislocation. There is no evidence of arthropathy or other focal bone abnormality. IMPRESSION: Negative. If pain persists despite conservative therapy, MRI may be warranted for further characterization. Electronically  Signed   By: Gaylyn RongWalter  Liebkemann M.D.   On: 02/16/2016 14:50   I have personally reviewed and evaluated these images and lab results as part of my medical decision-making.   EKG Interpretation None      MDM   Final diagnoses:  None  Patient presents today with right hip pain.  No acute injury or trauma.  No signs of infection.  Full ROM of the hip.  Patient able to ambulate.  Neurovascularly intact.  Patient stable for discharge.  Given referral to orthopedics to follow up with if pain persists.  Return precautions given.  I personally performed the services described in this documentation, which was scribed in my presence. The recorded information has been reviewed and is accurate.    Santiago GladHeather Rodnesha Elie, PA-C 02/17/16 1622  Lavera Guiseana Duo Liu, MD 02/17/16 [phone]

## 2016-02-16 NOTE — ED Notes (Addendum)
C/o intermittent right hip pain x one week.

## 2016-02-16 NOTE — ED Notes (Signed)
Pt stable, ambulatory, states understanding of discharge instructions 

## 2016-02-16 NOTE — ED Notes (Signed)
Pt reports right hip pain x 1 week. No swelling to leg, denies injury. Pt is ambulatory.

## 2017-02-04 IMAGING — DX DG HIP (WITH OR WITHOUT PELVIS) 2-3V*R*
3 series · 3 of 3 positions shown · non-contrast
Comparison: None.

CLINICAL DATA: Right lateral hip pain.  New

EXAM:
DG HIP (WITH OR WITHOUT PELVIS) 2-3V RIGHT

[pelvis ap]
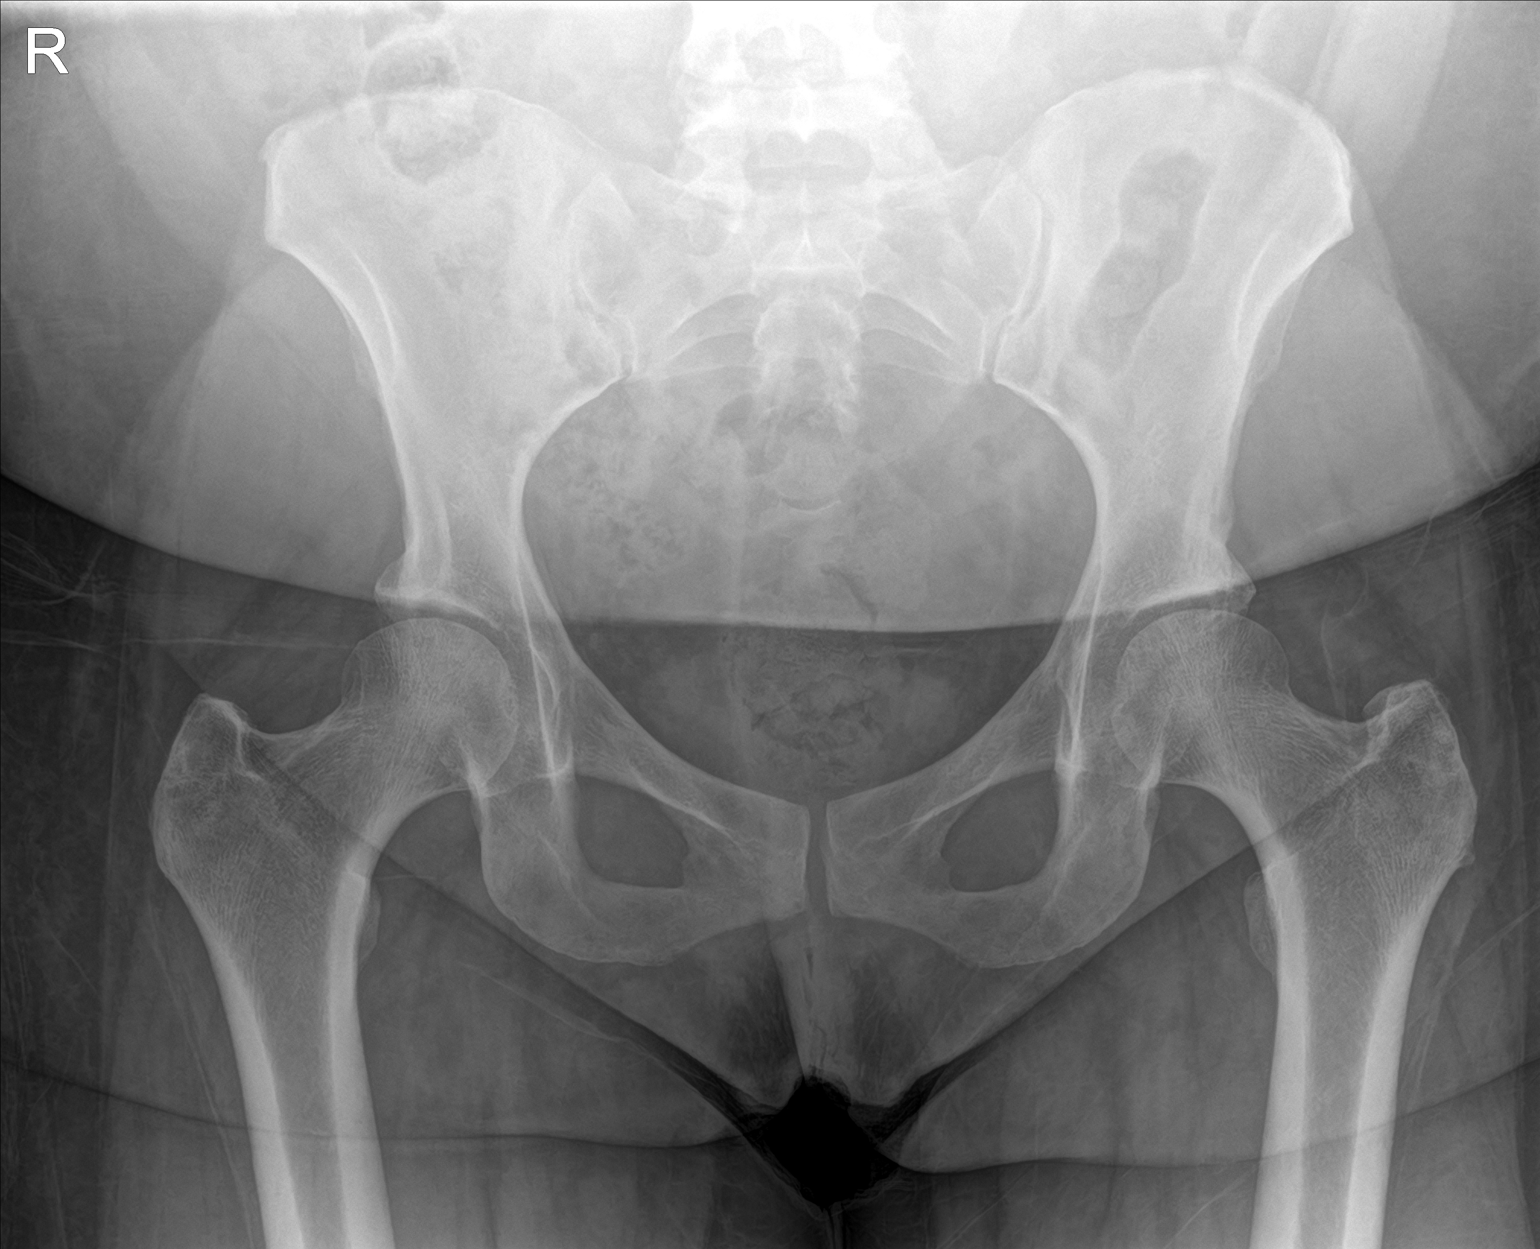

[hip ap]
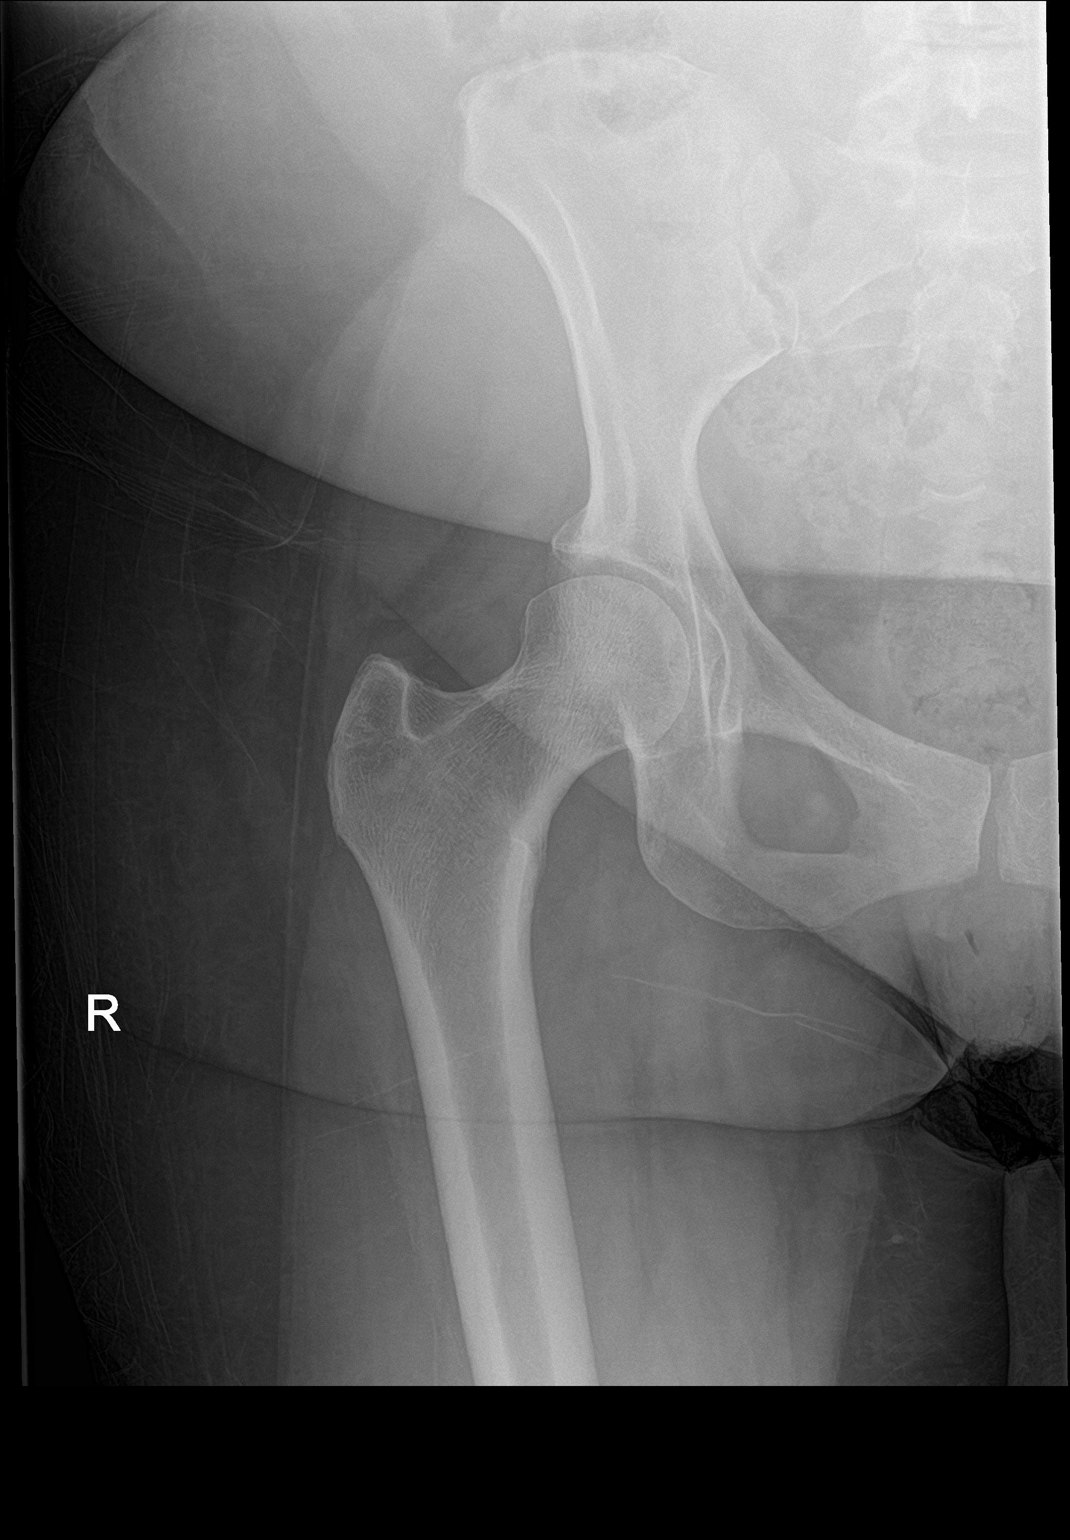

[hip lat]
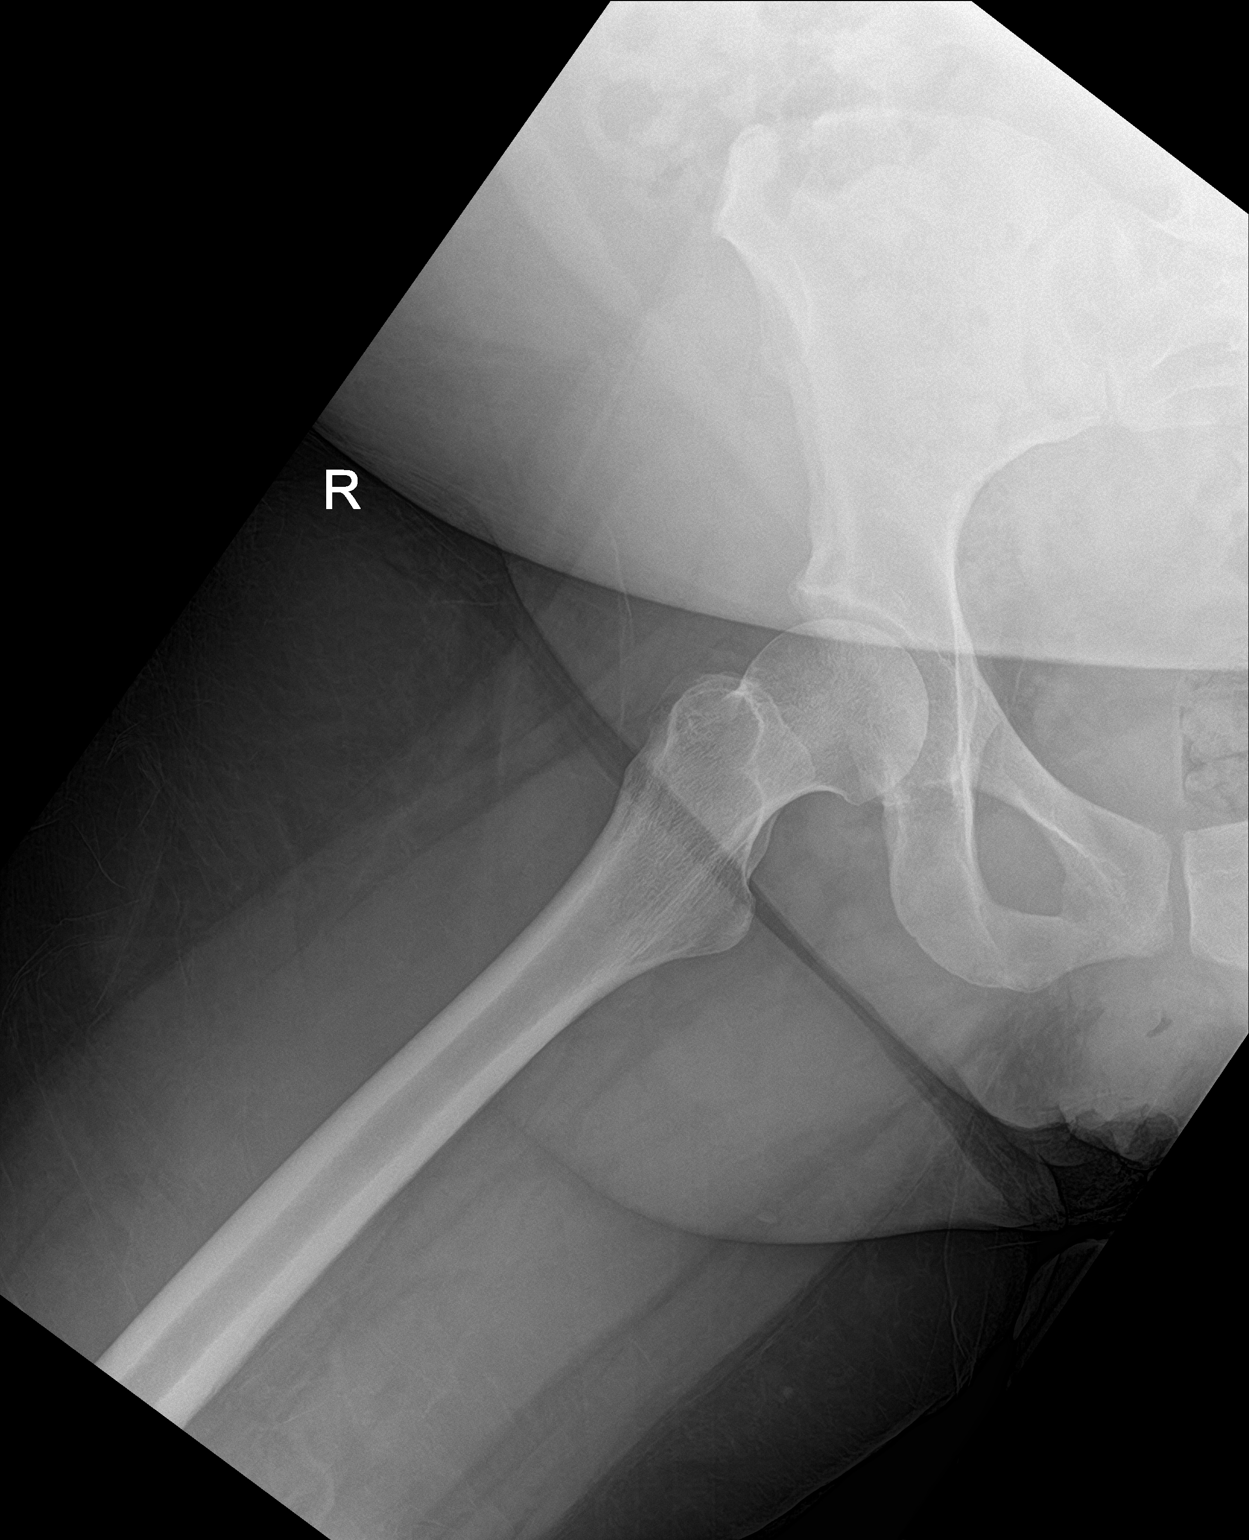

[3 of 3 positions shown; findings below may reference images not displayed]

FINDINGS: There is no evidence of hip fracture or dislocation. There is no
evidence of arthropathy or other focal bone abnormality.
IMPRESSION: Negative. If pain persists despite conservative therapy, MRI may be
warranted for further characterization.

## 2017-08-22 ENCOUNTER — Other Ambulatory Visit: Payer: Self-pay

## 2017-08-22 ENCOUNTER — Ambulatory Visit (INDEPENDENT_AMBULATORY_CARE_PROVIDER_SITE_OTHER): Payer: Self-pay | Admitting: Physician Assistant

## 2017-08-22 ENCOUNTER — Encounter (INDEPENDENT_AMBULATORY_CARE_PROVIDER_SITE_OTHER): Payer: Self-pay | Admitting: Physician Assistant

## 2017-08-22 VITALS — BP 167/118 | HR 92 | Temp 97.7°F | Wt 271.2 lb

## 2017-08-22 DIAGNOSIS — I10 Essential (primary) hypertension: Secondary | ICD-10-CM

## 2017-08-22 DIAGNOSIS — E1141 Type 2 diabetes mellitus with diabetic mononeuropathy: Secondary | ICD-10-CM

## 2017-08-22 DIAGNOSIS — R0602 Shortness of breath: Secondary | ICD-10-CM

## 2017-08-22 LAB — GLUCOSE, POCT (MANUAL RESULT ENTRY): POC Glucose: 97 mg/dl (ref 70–99)

## 2017-08-22 MED ORDER — HYDROCHLOROTHIAZIDE 25 MG PO TABS: 25.0000 mg | ORAL_TABLET | Freq: Every day | ORAL | 3 refills | Status: AC

## 2017-08-22 MED ORDER — GLIMEPIRIDE 4 MG PO TABS: 4.0000 mg | ORAL_TABLET | Freq: Every day | ORAL | 3 refills | Status: DC

## 2017-08-22 MED ORDER — METFORMIN HCL 1000 MG PO TABS: 1000.0000 mg | ORAL_TABLET | Freq: Two times a day (BID) | ORAL | 3 refills | Status: DC

## 2017-08-22 MED ORDER — LOSARTAN POTASSIUM 50 MG PO TABS: 50.0000 mg | ORAL_TABLET | Freq: Every day | ORAL | 3 refills | Status: DC

## 2017-08-22 MED ORDER — METFORMIN HCL 1000 MG PO TABS: 1000.0000 mg | ORAL_TABLET | Freq: Two times a day (BID) | ORAL | 3 refills | Status: AC

## 2017-08-22 MED ORDER — HYDROCHLOROTHIAZIDE 25 MG PO TABS: 25.0000 mg | ORAL_TABLET | Freq: Every day | ORAL | 3 refills | Status: DC

## 2017-08-22 MED ORDER — ALBUTEROL SULFATE HFA 108 (90 BASE) MCG/ACT IN AERS: 2.0000 | INHALATION_SPRAY | RESPIRATORY_TRACT | 5 refills | Status: DC | PRN

## 2017-08-22 MED ORDER — LOSARTAN POTASSIUM 50 MG PO TABS: 50.0000 mg | ORAL_TABLET | Freq: Every day | ORAL | 3 refills | Status: AC

## 2017-08-22 MED ORDER — AMLODIPINE BESYLATE 10 MG PO TABS: 10.0000 mg | ORAL_TABLET | Freq: Every day | ORAL | 3 refills | Status: DC

## 2017-08-22 MED ORDER — AMLODIPINE BESYLATE 10 MG PO TABS: 10.0000 mg | ORAL_TABLET | Freq: Every day | ORAL | 3 refills | Status: AC

## 2017-08-22 NOTE — Patient Instructions (Signed)

## 2017-08-22 NOTE — Progress Notes (Signed)
Subjective:  Patient ID: Tracie Ellis, female    DOB: 1966/02/29  Age: 51 y.o. MRN: [id number]  CC: HTN, DM, wheezing  HPI Tracie Longestnnette Gertner is a 51 y.o. female with a medical history of DM2 and HTN presents as a new patient for management of HTN and DM. She has been feeling dyspnea, wheezing, has orthopnea,  PND, few palpitations, left hallux numbness, and few headaches. Does not endorse CP, claudication, presyncope, syncope, f/c/n/v, or LE edema.     In regards to DM2. Had been hospitalized previously for DM. Does not have records for review. Reports polydipsia, polyuria, and visual blurring. Does not regularly check her blood sugar level.        Outpatient Medications Prior to Visit  Medication Sig Dispense Refill  . hydrochlorothiazide (HYDRODIURIL) 12.5 MG tablet Take 1 tablet (12.5 mg total) by mouth daily. (Patient not taking: Reported on 08/22/2017) 30 tablet 0  . Multiple Vitamin (MULTIVITAMIN WITH MINERALS) TABS tablet Take 1 tablet by mouth daily.    . naproxen (NAPROSYN) 500 MG tablet Take 1 tablet (500 mg total) by mouth 2 (two) times daily. (Patient not taking: Reported on 08/22/2017) 30 tablet 0  . traMADol (ULTRAM) 50 MG tablet Take 1 tablet (50 mg total) by mouth every 6 (six) hours as needed. (Patient not taking: Reported on 08/22/2017) 15 tablet 0   No facility-administered medications prior to visit.      ROS Review of Systems  Constitutional: Negative for chills, fever and malaise/fatigue.  Eyes: Negative for blurred vision.  Respiratory: Positive for shortness of breath.   Cardiovascular: Positive for palpitations. Negative for chest pain.  Gastrointestinal: Negative for abdominal pain and nausea.  Genitourinary: Negative for dysuria and hematuria.  Musculoskeletal: Negative for joint pain and myalgias.  Skin: Negative for rash.  Neurological: Negative for tingling and headaches.  Endo/Heme/Allergies: Positive for polydipsia.  Psychiatric/Behavioral:  Negative for depression. The patient is not nervous/anxious.     Objective:  BP (!) 167/118 (BP Location: Left Arm, Patient Position: Sitting, Cuff Size: Large)   Pulse 92   Temp 97.7 F (36.5 C) (Oral)   Wt 271 lb 3.2 oz (123 kg)   SpO2 97%   BMI 38.91 kg/m   BP/Weight 08/22/2017 02/16/2016 06/15/2014  Systolic BP 167 159 159  Diastolic BP 118 101 81  Wt. (Lbs) 271.2 - -  BMI 38.91 - -      Physical Exam  Constitutional: She is oriented to person, place, and time.  Well developed, overweight, NAD, polite  HENT:  Head: Normocephalic and atraumatic.  Eyes: No scleral icterus.  Neck: Normal range of motion. Neck supple. No thyromegaly present.  Cardiovascular: Normal rate, regular rhythm and normal heart sounds. Exam reveals no gallop and no friction rub.  No murmur heard. Pulmonary/Chest: Effort normal. No respiratory distress. She has wheezes (mild diffuse wheezing throughout). She has no rales.  Abdominal: Soft. Bowel sounds are normal. She exhibits no distension. There is no tenderness.  Musculoskeletal: She exhibits no edema.  Neurological: She is alert and oriented to person, place, and time. No cranial nerve deficit. Coordination normal.  Skin: Skin is warm and dry. No rash noted. No erythema. No pallor.  Psychiatric: She has a normal mood and affect. Her behavior is normal. Thought content normal.  Vitals reviewed.    Assessment & Plan:   1. Hypertension, unspecified type - CBC with Differential - Comprehensive metabolic panel - TSH - Begin hydrochlorothiazide (HYDRODIURIL) 25 MG tablet; Take 1 tablet (25 mg  total) by mouth daily. Take on tablet in the morning.  Dispense: 90 tablet; Refill: 3 - Begin losartan (COZAAR) 50 MG tablet; Take 1 tablet (50 mg total) by mouth daily.  Dispense: 90 tablet; Refill: 3 - Begin amLODipine (NORVASC) 10 MG tablet; Take 1 tablet (10 mg total) by mouth daily.  Dispense: 90 tablet; Refill: 3 - Administered 0.1 mg clonidine  2.  Type 2 diabetes mellitus with diabetic mononeuropathy, without long-term current use of insulin (HCC) - Hemoglobin A1c lab draw sent to [facility]. No A1c cartridges in clinic today. - CBC with Differential - Comprehensive metabolic panel - TSH - Glucose (CBG) 97 in clinic today. - Begin metFORMIN (GLUCOPHAGE) 1000 MG tablet; Take 1 tablet (1,000 mg total) by mouth 2 (two) times daily with a meal.  Dispense: 180 tablet; Refill: 3 - Begin glimepiride (AMARYL) 4 MG tablet; Take 1 tablet (4 mg total) by mouth daily before breakfast.  Dispense: 30 tablet; Refill: 3. I have asked patient to wait on A1c result before taking glimepiride. She expressed understanding and will take only metformin for now.   3. Shortness of breath - Likely attributed to diastolic heart failure 2/2 long standing HTN    Meds ordered this encounter  Medications  . DISCONTD: hydrochlorothiazide (HYDRODIURIL) 25 MG tablet    Sig: Take 1 tablet (25 mg total) by mouth daily. Take on tablet in the morning.    Dispense:  90 tablet    Refill:  3    Order Specific Question:   Supervising Provider    Answer:   Quentin Angst [id number]  . DISCONTD: amLODipine (NORVASC) 10 MG tablet    Sig: Take 1 tablet (10 mg total) by mouth daily.    Dispense:  90 tablet    Refill:  3    Order Specific Question:   Supervising Provider    Answer:   Quentin Angst [id number]  . DISCONTD: losartan (COZAAR) 50 MG tablet    Sig: Take 1 tablet (50 mg total) by mouth daily.    Dispense:  90 tablet    Refill:  3    Order Specific Question:   Supervising Provider    Answer:   Quentin Angst [id number]  . DISCONTD: metFORMIN (GLUCOPHAGE) 1000 MG tablet    Sig: Take 1 tablet (1,000 mg total) by mouth 2 (two) times daily with a meal.    Dispense:  180 tablet    Refill:  3    Order Specific Question:   Supervising Provider    Answer:   Quentin Angst [id number]  . DISCONTD: glimepiride (AMARYL) 4 MG tablet    Sig: Take 1  tablet (4 mg total) by mouth daily before breakfast.    Dispense:  30 tablet    Refill:  3    Order Specific Question:   Supervising Provider    Answer:   Quentin Angst [id number]  . metFORMIN (GLUCOPHAGE) 1000 MG tablet    Sig: Take 1 tablet (1,000 mg total) by mouth 2 (two) times daily with a meal.    Dispense:  180 tablet    Refill:  3    Order Specific Question:   Supervising Provider    Answer:   Quentin Angst [id number]  . hydrochlorothiazide (HYDRODIURIL) 25 MG tablet    Sig: Take 1 tablet (25 mg total) by mouth daily. Take on tablet in the morning.    Dispense:  90 tablet    Refill:  3    Order  Specific Question:   Supervising Provider    Answer:   Quentin AngstJEGEDE, OLUGBEMIGA E [id number]]  . losartan (COZAAR) 50 MG tablet    Sig: Take 1 tablet (50 mg total) by mouth daily.    Dispense:  90 tablet    Refill:  3    Order Specific Question:   Supervising Provider    Answer:   Quentin AngstJEGEDE, OLUGBEMIGA E [id number]]  . amLODipine (NORVASC) 10 MG tablet    Sig: Take 1 tablet (10 mg total) by mouth daily.    Dispense:  90 tablet    Refill:  3    Order Specific Question:   Supervising Provider    Answer:   Quentin AngstJEGEDE, OLUGBEMIGA E [id number]]  . glimepiride (AMARYL) 4 MG tablet    Sig: Take 1 tablet (4 mg total) by mouth daily before breakfast.    Dispense:  30 tablet    Refill:  3    Order Specific Question:   Supervising Provider    Answer:   Quentin AngstJEGEDE, OLUGBEMIGA E [id number]]  . albuterol (PROVENTIL HFA;VENTOLIN HFA) 108 (90 Base) MCG/ACT inhaler    Sig: Inhale 2 puffs into the lungs every 4 (four) hours as needed for wheezing or shortness of breath.    Dispense:  1 Inhaler    Refill:  5    Order Specific Question:   Supervising Provider    Answer:   Quentin AngstJEGEDE, OLUGBEMIGA E [id number]]    Follow-up: Return in about 4 weeks (around 09/19/2017) for htn.   Loletta Specteroger David Starleen Trussell PA

## 2017-08-23 ENCOUNTER — Telehealth (INDEPENDENT_AMBULATORY_CARE_PROVIDER_SITE_OTHER): Payer: Self-pay

## 2017-08-23 LAB — CBC WITH DIFFERENTIAL/PLATELET
Basophils Absolute: 0 10*3/uL (ref 0.0–0.2)
Basos: 0 %
EOS (ABSOLUTE): 0.4 10*3/uL (ref 0.0–0.4)
Eos: 8 %
Hematocrit: 41.8 % (ref 34.0–46.6)
Hemoglobin: 13.5 g/dL (ref 11.1–15.9)
IMMATURE GRANS (ABS): 0 10*3/uL (ref 0.0–0.1)
IMMATURE GRANULOCYTES: 0 %
LYMPHS: 36 %
Lymphocytes Absolute: 1.9 10*3/uL (ref 0.7–3.1)
MCH: 28.4 pg (ref 26.6–33.0)
MCHC: 32.3 g/dL (ref 31.5–35.7)
MCV: 88 fL (ref 79–97)
MONOS ABS: 0.3 10*3/uL (ref 0.1–0.9)
Monocytes: 6 %
NEUTROS PCT: 50 %
Neutrophils Absolute: 2.6 10*3/uL (ref 1.4–7.0)
PLATELETS: 304 10*3/uL (ref 150–379)
RBC: 4.76 x10E6/uL (ref 3.77–5.28)
RDW: 13.8 % (ref 12.3–15.4)
WBC: 5.2 10*3/uL (ref 3.4–10.8)

## 2017-08-23 LAB — COMPREHENSIVE METABOLIC PANEL
ALT: 15 IU/L (ref 0–32)
AST: 15 IU/L (ref 0–40)
Albumin/Globulin Ratio: 1.4 (ref 1.2–2.2)
Albumin: 4.3 g/dL (ref 3.5–5.5)
Alkaline Phosphatase: 64 IU/L (ref 39–117)
BUN/Creatinine Ratio: 9 (ref 9–23)
BUN: 9 mg/dL (ref 6–24)
Bilirubin Total: 0.3 mg/dL (ref 0.0–1.2)
CALCIUM: 9.3 mg/dL (ref 8.7–10.2)
CO2: 28 mmol/L (ref 20–29)
CREATININE: 0.95 mg/dL (ref 0.57–1.00)
Chloride: 106 mmol/L (ref 96–106)
GFR calc Af Amer: 79 mL/min/{1.73_m2} (ref >59)
GFR, EST NON AFRICAN AMERICAN: 69 mL/min/{1.73_m2} (ref >59)
GLUCOSE: 109 mg/dL — AB (ref 65–99)
Globulin, Total: 3 g/dL (ref 1.5–4.5)
POTASSIUM: 4.4 mmol/L (ref 3.5–5.2)
Sodium: 145 mmol/L — ABNORMAL HIGH (ref 134–144)
Total Protein: 7.3 g/dL (ref 6.0–8.5)

## 2017-08-23 LAB — HEMOGLOBIN A1C
Est. average glucose Bld gHb Est-mCnc: 151 mg/dL
HEMOGLOBIN A1C: 6.9 % — AB (ref 4.8–5.6)

## 2017-08-23 LAB — TSH: TSH: 1.93 u[IU]/mL (ref 0.450–4.500)

## 2017-08-23 NOTE — Telephone Encounter (Signed)
Left patient a message detailing the following results, A1c 6.9. She will do well with just the Metformin. Does not need to take glimepiride. Rest of the labs are normal. Call the office with any questions. Maryjean Mornempestt S Roberts, CMA

## 2017-08-23 NOTE — Telephone Encounter (Signed)
-----   Message from Loletta Specteroger David Gomez, PA-C sent at 08/23/2017  9:05 AM EST ----- A1c 6.9. She will do well with just the Metformin. Does not need to take glimepiride.  Rest of the labs are normal.

## 2017-09-19 ENCOUNTER — Encounter (INDEPENDENT_AMBULATORY_CARE_PROVIDER_SITE_OTHER): Payer: Self-pay | Admitting: Physician Assistant

## 2017-09-19 ENCOUNTER — Other Ambulatory Visit: Payer: Self-pay

## 2017-09-19 ENCOUNTER — Ambulatory Visit (INDEPENDENT_AMBULATORY_CARE_PROVIDER_SITE_OTHER): Payer: Self-pay | Admitting: Physician Assistant

## 2017-09-19 VITALS — BP 124/87 | HR 103 | Temp 98.1°F | Wt 269.2 lb

## 2017-09-19 DIAGNOSIS — E119 Type 2 diabetes mellitus without complications: Secondary | ICD-10-CM

## 2017-09-19 DIAGNOSIS — Z23 Encounter for immunization: Secondary | ICD-10-CM

## 2017-09-19 DIAGNOSIS — I10 Essential (primary) hypertension: Secondary | ICD-10-CM

## 2017-09-19 LAB — GLUCOSE, POCT (MANUAL RESULT ENTRY): POC GLUCOSE: 98 mg/dL (ref 70–99)

## 2017-09-19 MED ORDER — GLIMEPIRIDE 4 MG PO TABS: 4.0000 mg | ORAL_TABLET | Freq: Every day | ORAL | 3 refills | Status: AC

## 2017-09-19 NOTE — Patient Instructions (Signed)

## 2017-09-19 NOTE — Progress Notes (Signed)
Subjective:  Patient ID: Tracie Ellis, female    DOB: September 20, 1965  Age: 52 y.o. MRN: [id number]  CC: f/u HTN  HPI  Tracie Ellis is a 52 y.o. female with a medical history of DM2 and HTN presents to f/u on HTN. 167/118 mmHg one month ago. Prescribed HCTZ 25 mg, Losartan 50 mg, and Amlodipine 10 mg. BP 124/87 mmHg in clinic today. Feeling better than previous visit especially regarding foot pain. Believes reducing her blood pressure has reduced foot pain.     In regards to Diabetes, endorses some numbness in the toes at times, denies visual blurring, fatigue, polydipsia, or polyuria. CBG 98 in clinic today. Next A1c due 11/20/2017.    Outpatient Medications Prior to Visit  Medication Sig Dispense Refill  . albuterol (PROVENTIL HFA;VENTOLIN HFA) 108 (90 Base) MCG/ACT inhaler Inhale 2 puffs into the lungs every 4 (four) hours as needed for wheezing or shortness of breath. 1 Inhaler 5  . amLODipine (NORVASC) 10 MG tablet Take 1 tablet (10 mg total) by mouth daily. 90 tablet 3  . glimepiride (AMARYL) 4 MG tablet Take 1 tablet (4 mg total) by mouth daily before breakfast. 30 tablet 3  . hydrochlorothiazide (HYDRODIURIL) 25 MG tablet Take 1 tablet (25 mg total) by mouth daily. Take on tablet in the morning. 90 tablet 3  . losartan (COZAAR) 50 MG tablet Take 1 tablet (50 mg total) by mouth daily. 90 tablet 3  . metFORMIN (GLUCOPHAGE) 1000 MG tablet Take 1 tablet (1,000 mg total) by mouth 2 (two) times daily with a meal. 180 tablet 3  . Multiple Vitamin (MULTIVITAMIN WITH MINERALS) TABS tablet Take 1 tablet by mouth daily.     No facility-administered medications prior to visit.      ROS Review of Systems  Constitutional: Negative for chills, fever and malaise/fatigue.  Eyes: Negative for blurred vision.  Respiratory: Negative for shortness of breath.   Cardiovascular: Negative for chest pain and palpitations.  Gastrointestinal: Negative for abdominal pain and nausea.  Genitourinary:  Negative for dysuria and hematuria.  Musculoskeletal: Negative for joint pain and myalgias.  Skin: Negative for rash.  Neurological: Negative for tingling and headaches.  Psychiatric/Behavioral: Negative for depression. The patient is not nervous/anxious.     Objective:  BP 124/87 (BP Location: Left Arm, Patient Position: Sitting, Cuff Size: Large)   Pulse (!) 103   Temp 98.1 F (36.7 C) (Oral)   Wt 269 lb 3.2 oz (122.1 kg)   SpO2 96%   BMI 38.63 kg/m   BP/Weight 09/19/2017 08/22/2017 02/16/2016  Systolic BP 124 167 159  Diastolic BP 87 118 101  Wt. (Lbs) 269.2 271.2 -  BMI 38.63 38.91 -      Physical Exam  Constitutional: She is oriented to person, place, and time.  Well developed, obese, NAD, polite  HENT:  Head: Normocephalic and atraumatic.  Eyes: No scleral icterus.  Neck: Normal range of motion.  Cardiovascular: Normal rate, regular rhythm and normal heart sounds.  Pulmonary/Chest: Effort normal and breath sounds normal.  Neurological: She is alert and oriented to person, place, and time. Coordination normal.  Skin: Skin is warm and dry. No rash noted. No erythema. No pallor.  Psychiatric: She has a normal mood and affect. Her behavior is normal. Thought content normal.  Vitals reviewed.    Assessment & Plan:    1. Hypertension, unspecified type - Well controlled on HCTZ 25 mg, Losartan 50 mg, and Amlodipine 10 mg.  2. Type 2 diabetes mellitus without  complication, without long-term current use of insulin (HCC) - Glucose (CBG) 98 in clinic today. - Refill glimepiride (AMARYL) 4 MG tablet; Take 1 tablet (4 mg total) by mouth daily before breakfast.  Dispense: 90 tablet; Refill: 3  3. Need for Tdap vaccination - Tdap vaccine greater than or equal to 7yo IM  4. Need for pneumococcal vaccination - Administered Pneumococcal conjugate vaccine 13-valent      Meds ordered this encounter  Medications  . glimepiride (AMARYL) 4 MG tablet    Sig: Take 1 tablet  (4 mg total) by mouth daily before breakfast.    Dispense:  90 tablet    Refill:  3    Order Specific Question:   Supervising Provider    Answer:   Quentin Angst [[id number]]    Follow-up: Return in about 2 weeks (around 10/03/2017) for annual physical.   Loletta Specter PA

## 2017-10-04 ENCOUNTER — Ambulatory Visit (INDEPENDENT_AMBULATORY_CARE_PROVIDER_SITE_OTHER): Payer: Self-pay | Admitting: Physician Assistant

## 2017-10-04 ENCOUNTER — Other Ambulatory Visit (HOSPITAL_COMMUNITY)
Admission: RE | Admit: 2017-10-04 | Discharge: 2017-10-04 | Disposition: A | Discharge status: Home / Self Care | Payer: Self-pay | Source: Ambulatory Visit | Attending: Physician Assistant | Admitting: Physician Assistant

## 2017-10-04 ENCOUNTER — Encounter (INDEPENDENT_AMBULATORY_CARE_PROVIDER_SITE_OTHER): Payer: Self-pay | Admitting: Physician Assistant

## 2017-10-04 VITALS — BP 120/77 | HR 102 | Temp 97.8°F | Resp 18 | Ht 68.0 in | Wt 267.0 lb

## 2017-10-04 DIAGNOSIS — R062 Wheezing: Secondary | ICD-10-CM

## 2017-10-04 DIAGNOSIS — E119 Type 2 diabetes mellitus without complications: Secondary | ICD-10-CM

## 2017-10-04 DIAGNOSIS — Z114 Encounter for screening for human immunodeficiency virus [HIV]: Secondary | ICD-10-CM

## 2017-10-04 DIAGNOSIS — Z124 Encounter for screening for malignant neoplasm of cervix: Secondary | ICD-10-CM

## 2017-10-04 DIAGNOSIS — Z1231 Encounter for screening mammogram for malignant neoplasm of breast: Secondary | ICD-10-CM

## 2017-10-04 DIAGNOSIS — Z0000 Encounter for general adult medical examination without abnormal findings: Secondary | ICD-10-CM

## 2017-10-04 DIAGNOSIS — Z1211 Encounter for screening for malignant neoplasm of colon: Secondary | ICD-10-CM

## 2017-10-04 DIAGNOSIS — Z1239 Encounter for other screening for malignant neoplasm of breast: Secondary | ICD-10-CM

## 2017-10-04 DIAGNOSIS — Z1159 Encounter for screening for other viral diseases: Secondary | ICD-10-CM

## 2017-10-04 LAB — GLUCOSE, POCT (MANUAL RESULT ENTRY): POC GLUCOSE: 120 mg/dL — AB (ref 70–99)

## 2017-10-04 MED ORDER — ALBUTEROL SULFATE HFA 108 (90 BASE) MCG/ACT IN AERS: 2.0000 | INHALATION_SPRAY | RESPIRATORY_TRACT | 5 refills | Status: AC | PRN

## 2017-10-04 NOTE — Patient Instructions (Signed)
Diabetes Mellitus and Nutrition When you have diabetes (diabetes mellitus), it is very important to have healthy eating habits because your blood sugar (glucose) levels are greatly affected by what you eat and drink. Eating healthy foods in the appropriate amounts, at about the same times every day, can help you:  Control your blood glucose.  Lower your risk of heart disease.  Improve your blood pressure.  Reach or maintain a healthy weight.  Every person with diabetes is different, and each person has different needs for a meal plan. Your health care provider may recommend that you work with a diet and nutrition specialist (dietitian) to make a meal plan that is best for you. Your meal plan may vary depending on factors such as:  The calories you need.  The medicines you take.  Your weight.  Your blood glucose, blood pressure, and cholesterol levels.  Your activity level.  Other health conditions you have, such as heart or kidney disease.  How do carbohydrates affect me? Carbohydrates affect your blood glucose level more than any other type of food. Eating carbohydrates naturally increases the amount of glucose in your blood. Carbohydrate counting is a method for keeping track of how many carbohydrates you eat. Counting carbohydrates is important to keep your blood glucose at a healthy level, especially if you use insulin or take certain oral diabetes medicines. It is important to know how many carbohydrates you can safely have in each meal. This is different for every person. Your dietitian can help you calculate how many carbohydrates you should have at each meal and for snack. Foods that contain carbohydrates include:  Bread, cereal, rice, pasta, and crackers.  Potatoes and corn.  Peas, beans, and lentils.  Milk and yogurt.  Fruit and juice.  Desserts, such as cakes, cookies, ice cream, and candy.  How does alcohol affect me? Alcohol can cause a sudden decrease in blood  glucose (hypoglycemia), especially if you use insulin or take certain oral diabetes medicines. Hypoglycemia can be a life-threatening condition. Symptoms of hypoglycemia (sleepiness, dizziness, and confusion) are similar to symptoms of having too much alcohol. If your health care provider says that alcohol is safe for you, follow these guidelines:  Limit alcohol intake to no more than 1 drink per day for nonpregnant women and 2 drinks per day for men. One drink equals 12 oz of beer, 5 oz of wine, or 1 oz of hard liquor.  Do not drink on an empty stomach.  Keep yourself hydrated with water, diet soda, or unsweetened iced tea.  Keep in mind that regular soda, juice, and other mixers may contain a lot of sugar and must be counted as carbohydrates.  What are tips for following this plan? Reading food labels  Start by checking the serving size on the label. The amount of calories, carbohydrates, fats, and other nutrients listed on the label are based on one serving of the food. Many foods contain more than one serving per package.  Check the total grams (g) of carbohydrates in one serving. You can calculate the number of servings of carbohydrates in one serving by dividing the total carbohydrates by 15. For example, if a food has 30 g of total carbohydrates, it would be equal to 2 servings of carbohydrates.  Check the number of grams (g) of saturated and trans fats in one serving. Choose foods that have low or no amount of these fats.  Check the number of milligrams (mg) of sodium in one serving. Most people   should limit total sodium intake to less than 2,300 mg per day.  Always check the nutrition information of foods labeled as "low-fat" or "nonfat". These foods may be higher in added sugar or refined carbohydrates and should be avoided.  Talk to your dietitian to identify your daily goals for nutrients listed on the label. Shopping  Avoid buying canned, premade, or processed foods. These  foods tend to be high in fat, sodium, and added sugar.  Shop around the outside edge of the grocery store. This includes fresh fruits and vegetables, bulk grains, fresh meats, and fresh dairy. Cooking  Use low-heat cooking methods, such as baking, instead of high-heat cooking methods like deep frying.  Cook using healthy oils, such as olive, canola, or sunflower oil.  Avoid cooking with butter, cream, or high-fat meats. Meal planning  Eat meals and snacks regularly, preferably at the same times every day. Avoid going long periods of time without eating.  Eat foods high in fiber, such as fresh fruits, vegetables, beans, and whole grains. Talk to your dietitian about how many servings of carbohydrates you can eat at each meal.  Eat 4-6 ounces of lean protein each day, such as lean meat, chicken, fish, eggs, or tofu. 1 ounce is equal to 1 ounce of meat, chicken, or fish, 1 egg, or 1/4 cup of tofu.  Eat some foods each day that contain healthy fats, such as avocado, nuts, seeds, and fish. Lifestyle   Check your blood glucose regularly.  Exercise at least 30 minutes 5 or more days each week, or as told by your health care provider.  Take medicines as told by your health care provider.  Do not use any products that contain nicotine or tobacco, such as cigarettes and e-cigarettes. If you need help quitting, ask your health care provider.  Work with a counselor or diabetes educator to identify strategies to manage stress and any emotional and social challenges. What are some questions to ask my health care provider?  Do I need to meet with a diabetes educator?  Do I need to meet with a dietitian?  What number can I call if I have questions?  When are the best times to check my blood glucose? Where to find more information:  American Diabetes Association: diabetes.org/food-and-fitness/food  Academy of Nutrition and Dietetics:  www.eatright.org/resources/health/diseases-and-conditions/diabetes  National Institute of Diabetes and Digestive and Kidney Diseases (NIH): www.niddk.nih.gov/health-information/diabetes/overview/diet-eating-physical-activity Summary  A healthy meal plan will help you control your blood glucose and maintain a healthy lifestyle.  Working with a diet and nutrition specialist (dietitian) can help you make a meal plan that is best for you.  Keep in mind that carbohydrates and alcohol have immediate effects on your blood glucose levels. It is important to count carbohydrates and to use alcohol carefully. This information is not intended to replace advice given to you by your health care provider. Make sure you discuss any questions you have with your health care provider. Document Released: 05/17/2005 Document Revised: 09/24/2016 Document Reviewed: 09/24/2016 Elsevier Interactive Patient Education  2018 Else[employer] Inc.  

## 2017-10-04 NOTE — Progress Notes (Signed)
Patient ID: Tracie Ellis, female    DOB: 02-29-1966  Age: 52 y.o. MRN: [id number]  CC: Annual exam  HPI  Tracie Ellis a 52 y.o.femalewith a medical history of DM2 and HTN presents for an annual physical exam. Needs PAP. Does not endorse any symptoms or complaints at this time. Takes medications as directed.    Outpatient Medications Prior to Visit  Medication Sig Dispense Refill  . amLODipine (NORVASC) 10 MG tablet Take 1 tablet (10 mg total) by mouth daily. 90 tablet 3  . glimepiride (AMARYL) 4 MG tablet Take 1 tablet (4 mg total) by mouth daily before breakfast. 90 tablet 3  . hydrochlorothiazide (HYDRODIURIL) 25 MG tablet Take 1 tablet (25 mg total) by mouth daily. Take on tablet in the morning. 90 tablet 3  . losartan (COZAAR) 50 MG tablet Take 1 tablet (50 mg total) by mouth daily. 90 tablet 3  . metFORMIN (GLUCOPHAGE) 1000 MG tablet Take 1 tablet (1,000 mg total) by mouth 2 (two) times daily with a meal. 180 tablet 3  . Multiple Vitamin (MULTIVITAMIN WITH MINERALS) TABS tablet Take 1 tablet by mouth daily.    Marland Kitchen. albuterol (PROVENTIL HFA;VENTOLIN HFA) 108 (90 Base) MCG/ACT inhaler Inhale 2 puffs into the lungs every 4 (four) hours as needed for wheezing or shortness of breath. 1 Inhaler 5   No facility-administered medications prior to visit.      ROS Review of Systems  Constitutional: Negative for chills, fever and malaise/fatigue.  Eyes: Negative for blurred vision.  Respiratory: Negative for shortness of breath.   Cardiovascular: Negative for chest pain and palpitations.  Gastrointestinal: Negative for abdominal pain and nausea.  Genitourinary: Negative for dysuria and hematuria.  Musculoskeletal: Negative for joint pain and myalgias.  Skin: Negative for rash.  Neurological: Negative for tingling and headaches.  Psychiatric/Behavioral: Negative for depression. The patient is not nervous/anxious.     Objective:  BP 120/77 (BP Location: Left Arm, Patient  Position: Sitting, Cuff Size: Large)   Pulse (!) 102   Temp 97.8 F (36.6 C) (Oral)   Resp 18   Ht 5\' 8"  (1.727 m)   Wt 267 lb (121.1 kg)   SpO2 96%   BMI 40.60 kg/m   BP/Weight 10/04/2017 09/19/2017 08/22/2017  Systolic BP 120 124 167  Diastolic BP 77 87 118  Wt. (Lbs) 267 269.2 271.2  BMI 40.6 38.63 38.91      Physical Exam  Constitutional: She is oriented to person, place, and time.  Well developed, well nourished, NAD, polite  HENT:  Head: Normocephalic and atraumatic.  Eyes: Conjunctivae and EOM are normal. Pupils are equal, round, and reactive to light. No scleral icterus.  Neck: Normal range of motion. Neck supple. No thyromegaly present.  Cardiovascular: Normal rate, regular rhythm and normal heart sounds.  Pulmonary/Chest: Effort normal and breath sounds normal. No respiratory distress.  Abdominal: Soft. Bowel sounds are normal. She exhibits no distension. There is no tenderness. There is no rebound and no guarding.  Musculoskeletal: She exhibits no edema.  Neurological: She is alert and oriented to person, place, and time.  Skin: Skin is warm and dry. No rash noted. No erythema. No pallor.  Psychiatric: She has a normal mood and affect. Her behavior is normal. Thought content normal.  Vitals reviewed.    Assessment & Plan:   1. Annual physical exam - CBC with Differential - Comprehensive metabolic panel - TSH - Lipid panel  2. Screening for HIV (human immunodeficiency virus) - HIV  antibody  3. Encounter for hepatitis C screening test for low risk patient - Hepatitis c antibody (reflex)  4. Screening for cervical cancer - Cytology - PAP([facility])  5. Screening for breast cancer - [facility] DIGITAL SCREENING BILATERAL; Future  6. Screening for colon cancer - Fecal occult blood, imunochemical  7. Type 2 diabetes mellitus without complication, without long-term current use of insulin (HCC) - Glucose (CBG) 120 - Last A1c 6.9% one month ago - Diabetic foot  exam conducted today with no deficiency found  8. 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

## 2017-10-05 LAB — CBC WITH DIFFERENTIAL/PLATELET
BASOS ABS: 0 10*3/uL (ref 0.0–0.2)
Basos: 0 %
EOS (ABSOLUTE): 0.2 10*3/uL (ref 0.0–0.4)
Eos: 3 %
HEMOGLOBIN: 12.9 g/dL (ref 11.1–15.9)
Hematocrit: 40.2 % (ref 34.0–46.6)
IMMATURE GRANS (ABS): 0 10*3/uL (ref 0.0–0.1)
IMMATURE GRANULOCYTES: 0 %
LYMPHS: 36 %
Lymphocytes Absolute: 2.5 10*3/uL (ref 0.7–3.1)
MCH: 27.9 pg (ref 26.6–33.0)
MCHC: 32.1 g/dL (ref 31.5–35.7)
MCV: 87 fL (ref 79–97)
MONOCYTES: 5 %
Monocytes Absolute: 0.3 10*3/uL (ref 0.1–0.9)
NEUTROS PCT: 56 %
Neutrophils Absolute: 4 10*3/uL (ref 1.4–7.0)
PLATELETS: 314 10*3/uL (ref 150–379)
RBC: 4.62 x10E6/uL (ref 3.77–5.28)
RDW: 13.7 % (ref 12.3–15.4)
WBC: 7.1 10*3/uL (ref 3.4–10.8)

## 2017-10-05 LAB — LIPID PANEL
CHOL/HDL RATIO: 3.9 ratio (ref 0.0–4.4)
Cholesterol, Total: 178 mg/dL (ref 100–199)
HDL: 46 mg/dL (ref >39)
LDL Calculated: 115 mg/dL — ABNORMAL HIGH (ref 0–99)
TRIGLYCERIDES: 83 mg/dL (ref 0–149)
VLDL CHOLESTEROL CAL: 17 mg/dL (ref 5–40)

## 2017-10-05 LAB — COMPREHENSIVE METABOLIC PANEL
A/G RATIO: 1.3 (ref 1.2–2.2)
ALT: 17 IU/L (ref 0–32)
AST: 15 IU/L (ref 0–40)
Albumin: 4.4 g/dL (ref 3.5–5.5)
Alkaline Phosphatase: 67 IU/L (ref 39–117)
BUN/Creatinine Ratio: 14 (ref 9–23)
BUN: 16 mg/dL (ref 6–24)
Bilirubin Total: 0.3 mg/dL (ref 0.0–1.2)
CALCIUM: 9.6 mg/dL (ref 8.7–10.2)
CO2: 25 mmol/L (ref 20–29)
CREATININE: 1.12 mg/dL — AB (ref 0.57–1.00)
Chloride: 99 mmol/L (ref 96–106)
GFR, EST AFRICAN AMERICAN: 65 mL/min/{1.73_m2} (ref >59)
GFR, EST NON AFRICAN AMERICAN: 56 mL/min/{1.73_m2} — AB (ref >59)
GLUCOSE: 97 mg/dL (ref 65–99)
Globulin, Total: 3.3 g/dL (ref 1.5–4.5)
Potassium: 3.6 mmol/L (ref 3.5–5.2)
Sodium: 143 mmol/L (ref 134–144)
Total Protein: 7.7 g/dL (ref 6.0–8.5)

## 2017-10-05 LAB — HIV ANTIBODY (ROUTINE TESTING W REFLEX): HIV Screen 4th Generation wRfx: NONREACTIVE

## 2017-10-05 LAB — TSH: TSH: 1.58 u[IU]/mL (ref 0.450–4.500)

## 2017-10-05 LAB — HEPATITIS C ANTIBODY (REFLEX): HCV Ab: <0.1 s/co ratio (ref 0.0–0.9)

## 2017-10-05 LAB — HCV COMMENT:

## 2017-10-07 ENCOUNTER — Other Ambulatory Visit (INDEPENDENT_AMBULATORY_CARE_PROVIDER_SITE_OTHER): Payer: Self-pay | Admitting: Physician Assistant

## 2017-10-07 DIAGNOSIS — E7841 Elevated Lipoprotein(a): Secondary | ICD-10-CM | POA: Insufficient documentation

## 2017-10-07 LAB — CYTOLOGY - PAP
Bacterial vaginitis: POSITIVE — AB
CHLAMYDIA, DNA PROBE: NEGATIVE
Candida vaginitis: NEGATIVE
Diagnosis: NEGATIVE
Neisseria Gonorrhea: NEGATIVE
TRICH (WINDOWPATH): NEGATIVE

## 2017-10-07 MED ORDER — LOVASTATIN 20 MG PO TABS: 20.0000 mg | ORAL_TABLET | Freq: Every day | ORAL | 3 refills | Status: AC

## 2017-10-08 ENCOUNTER — Telehealth (INDEPENDENT_AMBULATORY_CARE_PROVIDER_SITE_OTHER): Payer: Self-pay | Admitting: *Deleted

## 2017-10-08 ENCOUNTER — Other Ambulatory Visit (INDEPENDENT_AMBULATORY_CARE_PROVIDER_SITE_OTHER): Payer: Self-pay | Admitting: Physician Assistant

## 2017-10-08 DIAGNOSIS — N760 Acute vaginitis: Principal | ICD-10-CM

## 2017-10-08 DIAGNOSIS — B9689 Other specified bacterial agents as the cause of diseases classified elsewhere: Secondary | ICD-10-CM

## 2017-10-08 MED ORDER — METRONIDAZOLE 500 MG PO TABS: 500.0000 mg | ORAL_TABLET | Freq: Two times a day (BID) | ORAL | 0 refills | Status: AC

## 2017-10-08 NOTE — Telephone Encounter (Signed)
Medical Assistant left message on patient's home and cell voicemail. Voicemail states to give a call back to Nubia with [facility] at [phone].  

## 2017-10-08 NOTE — Telephone Encounter (Signed)
-----   Message from Loletta Specteroger David Gomez, PA-C sent at 10/07/2017 11:15 AM EST ----- HCV and HIV negative. Mildly elevated cholesterol. I have sent Lovastatin to [facility] [address]

## 2017-10-08 NOTE — Telephone Encounter (Signed)
-----   Message from Roger David Gomez, PA-C sent at 10/07/2017 11:15 AM EST ----- HCV and HIV negative. Mildly elevated cholesterol. I have sent Lovastatin to [facility] on [address] 

## 2017-10-08 NOTE — Telephone Encounter (Signed)
Medical Assistant left message on patient's home and cell voicemail. Voicemail states to give a call back [place] [facility] at [phone]. !!!Please inform patient of Hepatitis being negative and HIV. Patients cholesterol is mildly elevated. She may pick up lovastatin at the [facility] on [address], A recheck will be completed in 3-4 months!!!

## 2017-10-08 NOTE — Telephone Encounter (Signed)
Medical Assistant left message on patient's home and cell voicemail. Voicemail states to give a call back to [place] [facility] at [phone]. !!!Please inform patient of PAP being negative for cancer, but positive for BV. Flagyl was sent to the [facility] on [address] for pick up!!!

## 2017-10-08 NOTE — Telephone Encounter (Signed)
Patient verified DOB Patient is aware of HCV/HIV being negative and needing to pick up lovastatin from [facility] on [address] to address the mildly elevated cholesterol level. Patient is aware of a recheck being completed at the next visit. No further questions at this time.

## 2017-10-08 NOTE — Telephone Encounter (Signed)
-----   Message from Loletta Specteroger David Gomez, PA-C sent at 10/08/2017 12:26 PM EST ----- Pap is negative for malignancy. Positive for BV. I have sent metronidazole to [facility] on [address].

## 2018-01-01 ENCOUNTER — Ambulatory Visit (INDEPENDENT_AMBULATORY_CARE_PROVIDER_SITE_OTHER): Payer: Self-pay | Admitting: Physician Assistant
